# Patient Record
Sex: Female | Born: 1970 | Race: White | Hispanic: No | Marital: Single | Smoking: Never smoker
Health system: Southern US, Community
[De-identification: ages and names within clinical notes are randomized; demographics above are authoritative.]

## PROBLEM LIST (undated history)

## (undated) DIAGNOSIS — K829 Disease of gallbladder, unspecified: Secondary | ICD-10-CM

## (undated) DIAGNOSIS — K589 Irritable bowel syndrome without diarrhea: Secondary | ICD-10-CM

## (undated) DIAGNOSIS — I10 Essential (primary) hypertension: Secondary | ICD-10-CM

## (undated) DIAGNOSIS — R12 Heartburn: Secondary | ICD-10-CM

## (undated) DIAGNOSIS — M549 Dorsalgia, unspecified: Secondary | ICD-10-CM

## (undated) DIAGNOSIS — F329 Major depressive disorder, single episode, unspecified: Secondary | ICD-10-CM

## (undated) DIAGNOSIS — M2550 Pain in unspecified joint: Secondary | ICD-10-CM

## (undated) DIAGNOSIS — T7840XA Allergy, unspecified, initial encounter: Secondary | ICD-10-CM

## (undated) DIAGNOSIS — F419 Anxiety disorder, unspecified: Secondary | ICD-10-CM

## (undated) DIAGNOSIS — F32A Depression, unspecified: Secondary | ICD-10-CM

## (undated) DIAGNOSIS — J329 Chronic sinusitis, unspecified: Secondary | ICD-10-CM

## (undated) HISTORY — DX: Essential (primary) hypertension: I10

## (undated) HISTORY — DX: Depression, unspecified: F32.A

## (undated) HISTORY — PX: NASAL SINUS SURGERY: SHX719

## (undated) HISTORY — DX: Allergy, unspecified, initial encounter: T78.40XA

## (undated) HISTORY — DX: Anxiety disorder, unspecified: F41.9

## (undated) HISTORY — DX: Heartburn: R12

## (undated) HISTORY — DX: Irritable bowel syndrome, unspecified: K58.9

## (undated) HISTORY — DX: Chronic sinusitis, unspecified: J32.9

## (undated) HISTORY — DX: Pain in unspecified joint: M25.50

## (undated) HISTORY — DX: Major depressive disorder, single episode, unspecified: F32.9

## (undated) HISTORY — DX: Dorsalgia, unspecified: M54.9

## (undated) HISTORY — DX: Disease of gallbladder, unspecified: K82.9

---

## 2009-01-05 LAB — CONVERTED CEMR LAB: Pap Smear: NORMAL

## 2009-08-06 ENCOUNTER — Ambulatory Visit: Payer: Self-pay | Admitting: Internal Medicine

## 2009-08-06 DIAGNOSIS — I10 Essential (primary) hypertension: Secondary | ICD-10-CM | POA: Insufficient documentation

## 2009-08-06 DIAGNOSIS — F411 Generalized anxiety disorder: Secondary | ICD-10-CM | POA: Insufficient documentation

## 2009-08-06 DIAGNOSIS — F418 Other specified anxiety disorders: Secondary | ICD-10-CM

## 2010-02-03 ENCOUNTER — Ambulatory Visit: Payer: Self-pay | Admitting: Family Medicine

## 2010-02-06 LAB — CONVERTED CEMR LAB
ALT: 34 units/L (ref 0–35)
AST: 36 units/L (ref 0–37)
Albumin: 4.3 g/dL (ref 3.5–5.2)
Alkaline Phosphatase: 56 units/L (ref 39–117)
BUN: 11 mg/dL (ref 6–23)
Bilirubin, Direct: 0.1 mg/dL (ref 0.0–0.3)
Calcium: 9.2 mg/dL (ref 8.4–10.5)
Cholesterol: 187 mg/dL (ref 0–200)
Creatinine, Ser: 0.6 mg/dL (ref 0.4–1.2)
Direct LDL: 109.9 mg/dL
GFR calc non Af Amer: 114.08 mL/min (ref >60)
Total Protein: 7.1 g/dL (ref 6.0–8.3)
VLDL: 44.2 mg/dL — ABNORMAL HIGH (ref 0.0–40.0)

## 2010-02-10 ENCOUNTER — Encounter (INDEPENDENT_AMBULATORY_CARE_PROVIDER_SITE_OTHER): Payer: Self-pay | Admitting: *Deleted

## 2010-02-17 ENCOUNTER — Other Ambulatory Visit: Admission: RE | Admit: 2010-02-17 | Discharge: 2010-02-17 | Payer: Self-pay | Admitting: Family Medicine

## 2010-02-17 ENCOUNTER — Ambulatory Visit: Payer: Self-pay | Admitting: Family Medicine

## 2010-02-17 DIAGNOSIS — L439 Lichen planus, unspecified: Secondary | ICD-10-CM

## 2010-02-17 LAB — HM PAP SMEAR

## 2010-02-24 ENCOUNTER — Encounter: Payer: Self-pay | Admitting: Family Medicine

## 2010-02-24 LAB — CONVERTED CEMR LAB: Pap Smear: NEGATIVE

## 2010-03-31 ENCOUNTER — Ambulatory Visit: Payer: Self-pay | Admitting: Family Medicine

## 2010-06-07 HISTORY — PX: GALLBLADDER SURGERY: SHX652

## 2010-07-07 NOTE — Assessment & Plan Note (Signed)
Summary: FILL OUT FORM FOR WORK/CLE   Vital Signs:  Patient profile:   40 year old female Menstrual status:  regular Height:      65 inches Weight:      240 pounds BMI:     40.08 Temp:     98.8 degrees F oral Pulse rate:   84 / minute Pulse rhythm:   regular BP sitting:   130 / 90  (right arm) Cuff size:   large  Vitals Entered By: Linde Gillis CMA Duncan Dull) (March 31, 2010 8:07 AM) CC: fill out form for work   History of Present Illness: 40 yo here to fill out form for work. Needs a physical form, brings it in with her today.  Based on form, only immunization required is Tetanus.  She received Tdap on 06/07/2008.  Overall, doing well. 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  Current Medications (verified): 1)  Pristiq 100 Mg Xr24h-Tab (Desvenlafaxine Succinate) .... Take One Tablet By Mouth Daily 2)  Bentyl 20 Mg Tabs (Dicyclomine Hcl) .Marland Kitchen.. 1 Tab By Mouth Qid As Needed 3)  Lisinopril 20 Mg Tabs (Lisinopril) .Marland Kitchen.. 1 Tablet By Mouth Daily 4)  Alprazolam 1 Mg  Tabs (Alprazolam) .Marland Kitchen.. 1 Tab By Mouth Two Times A Day As Needed Anxiety 5)  Clobetasol Propionate 0.05 % Crea (Clobetasol Propionate) .... Apply To Area Nightly X 1 Month  Allergies (verified): No Known Drug Allergies  Past History:  Past Medical History: Last updated: 08/06/2009 Anxiety Depression Hypertension  Past Surgical History: Last updated: 08/06/2009 Sinus surgery  Family History: Last updated: 08/06/2009 Family History Diabetes 1st degree relative Family History Hypertension  Social History: Last updated: 02/03/2010 Occupation: Interior and spatial designer of Education Divorced Never Smoked Alcohol use-yes Drug use-no Regular exercise-yes One daughter, age 66, [place]  Risk Factors: Alcohol Use: 2 (08/06/2009) >5 drinks/d w/in last 3 months: no (08/06/2009) Exercise: yes (08/06/2009)  Risk Factors: Smoking Status: never (08/06/2009)  Review of Systems      See HPI General:  Denies  malaise. Eyes:  Denies blurring. ENT:  Denies difficulty swallowing. CV:  Denies chest pain or discomfort. Resp:  Denies shortness of breath. GI:  Denies abdominal pain and change in bowel habits. GU:  Denies discharge. MS:  Denies joint pain, joint redness, and joint swelling. Derm:  Denies rash. Neuro:  Denies headaches. Psych:  Denies anxiety and depression. Endo:  Denies cold intolerance and heat intolerance. Heme:  Denies abnormal bruising and bleeding.  Physical Exam  General:  alert, well-developed, well-nourished, well-hydrated, appropriate dress, normal appearance, healthy-appearing, cooperative to examination, and overweight-appearing.   Head:  normocephalic, atraumatic, no abnormalities observed, and no abnormalities palpated.   Eyes:  vision grossly intact, pupils equal, pupils round, and pupils reactive to light.   Ears:  R ear normal and L ear normal.   Nose:  no external deformity.   Mouth:  Oral mucosa and oropharynx without lesions or exudates.  Teeth in good repair. Neck:  supple, full ROM, no masses, no thyromegaly, no thyroid nodules or tenderness, no JVD, normal carotid upstroke, no cervical lymphadenopathy, and no neck tenderness.   Lungs:  normal respiratory effort, no intercostal retractions, no accessory muscle use, normal breath sounds, no dullness, and no fremitus.   Heart:  normal rate, regular rhythm, no murmur, no gallop, no rub, and no JVD.   Abdomen:  soft, non-tender, normal bowel sounds, no distention, no masses, no guarding, no rigidity, no hepatomegaly, and no splenomegaly.   Msk:  normal ROM, no  joint tenderness, no joint swelling, no joint warmth, no redness over joints, no joint deformities, no joint instability, and no crepitation.   Extremities:  No clubbing, cyanosis, edema, or deformity noted with normal full range of motion of all joints.   Neurologic:  No cranial nerve deficits noted. Station and gait are normal. Plantar reflexes are down-going  bilaterally. DTRs are symmetrical throughout. Sensory, motor and coordinative functions appear intact. Skin:  Intact without suspicious lesions or rashes Psych:  Cognition and judgment appear intact. Alert and cooperative with normal attention span and concentration. No apparent delusions, illusions, hallucinations   Impression & Recommendations:  Problem # 1:  UNSPECIFIED GENERAL MEDICAL EXAMINATION (ICD-V70.9) Assessment New Formed filled out and returned to patient stating that she has no physical limitations.  Complete Medication List: 1)  Pristiq 100 Mg Xr24h-tab (Desvenlafaxine succinate) .... Take one tablet by mouth daily 2)  Bentyl 20 Mg Tabs (Dicyclomine hcl) .Marland Kitchen.. 1 tab by mouth qid as needed 3)  Lisinopril 20 Mg Tabs (Lisinopril) .Marland Kitchen.. 1 tablet by mouth daily 4)  Alprazolam 1 Mg Tabs (Alprazolam) .Marland Kitchen.. 1 tab by mouth two times a day as needed anxiety 5)  Clobetasol Propionate 0.05 % Crea (Clobetasol propionate) .... Apply to area nightly x 1 month Prescriptions: ALPRAZOLAM 1 MG  TABS (ALPRAZOLAM) 1 tab by mouth two times a day as needed anxiety  #60 x 0   Entered and Authorized by:   Ruthe Mannan MD   Signed by:   Ruthe Mannan MD on 03/31/2010   Method used:   Print then Give to Patient   RxID:   [id number] CLOBETASOL PROPIONATE 0.05 % CREA (CLOBETASOL PROPIONATE) Apply to area nightly x 1 month  #30 g x 1   Entered and Authorized by:   Ruthe Mannan MD   Signed by:   Ruthe Mannan MD on 03/31/2010   Method used:   Electron[facility] to        [id number]       [address]       [place], [place]  [address]       Ph: [phone]       Fax: [phone]   RxID:   [id number] LISINOPRIL 20 MG TABS (LISINOPRIL) 1 tablet by mouth daily  #90 x 3   Entered and Authorized by:   Ruthe Mannan MD   Signed by:   Ruthe Mannan MD on 03/31/2010   Method used:   Electronically to        [id number] ([facility]       [address]        [place], [place]  [address]       Ph: [phone]       Fax: [phone]   RxID:   [id number] PRISTIQ 100 MG XR24H-TAB (DESVENLAFAXINE SUCCINATE) take one tablet by mouth daily  #30 x 6   Entered and Authorized by:   Ruthe Mannan MD   Signed by:   Ruthe Mannan MD on 03/31/2010   Method used:   Electronically to        [id number]       [address]       [place], [place]  [address]       Ph: [phone]       Fax: [phone]   RxID:   [id number]    Orders Added: 1)  Est. Patient 18-39 years [[address]]    Current Allergies (reviewed today): No known  allergies

## 2010-07-07 NOTE — Assessment & Plan Note (Signed)
Summary: PAP AND F/U HTN 30 MIN PER MD/DLO   Vital Signs:  Patient profile:   41 year old female Menstrual status:  regular Height:      65 inches Weight:      246 pounds BMI:     41.08 Temp:     98.6 degrees F oral Pulse rate:   80 / minute Pulse rhythm:   regular BP sitting:   130 / 90  (left arm) Cuff size:   large  Vitals Entered By: Linde Gillis CMA Duncan Dull) (February 17, 2010 12:07 PM) CC: pap and blood pressure concerns   History of Present Illness: 40 yo here for pap smear and to follow up blood pressure.   HTN- stopped the Atenolol 10 mg daily last month, feels much better.  Started Lisinopril 20 mg daily.  No known side effects, no dry cough, headaches, blurred vision or LE edema.   Well woman- no h/o abnormal pap smears.  Has noticed a redish, irritated area on her right labia.  Has told her OBGYN about in past, never been biopsied.  Current Medications (verified): 1)  Pristiq 100 Mg Xr24h-Tab (Desvenlafaxine Succinate) .... Take One Tablet By Mouth Daily 2)  Bentyl 20 Mg Tabs (Dicyclomine Hcl) .Marland Kitchen.. 1 Tab By Mouth Qid As Needed 3)  Lisinopril 20 Mg Tabs (Lisinopril) .Marland Kitchen.. 1 Tablet By Mouth Daily 4)  Alprazolam 1 Mg  Tabs (Alprazolam) .Marland Kitchen.. 1 Tab By Mouth Two Times A Day As Needed Anxiety 5)  Clobetasol Propionate 0.05 % Crea (Clobetasol Propionate) .... Apply To Area Nightly X 1 Month 6)  Azithromycin 250 Mg  Tabs (Azithromycin) .... 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 Eyes:  Denies blurring. CV:  Denies chest pain or discomfort. Resp:  Denies shortness of breath. GU:  Denies abnormal vaginal bleeding, discharge, dysuria, genital sores, urinary frequency, and urinary hesitancy.  Physical Exam  General:  alert, well-developed, well-nourished, well-hydrated, appropriate dress, normal appearance, healthy-appearing, cooperative to examination, and overweight-appearing.   Breasts:  No mass, nodules, thickening, tenderness, bulging, retraction, inflamation, nipple discharge or skin changes noted.   Lungs:  normal respiratory effort, no intercostal retractions, no accessory muscle use, normal breath sounds, no dullness, and no fremitus.   Heart:  normal rate, regular rhythm, no murmur, no gallop, no rub, and no JVD.   Abdomen:  soft, non-tender, normal bowel sounds, no distention, no masses, no guarding, no rigidity, no hepatomegaly, and no splenomegaly.   Genitalia:  Pelvic Exam:        External: right labia- 2 cm scaly erythematous area        Vagina: normal without lesions or masses        Cervix: normal without lesions or masses        Adnexa: normal bimanual exam without masses or fullness        Uterus: normal by palpation  Pap smear: performed Extremities:  No clubbing, cyanosis, edema, or deformity noted with normal full range of motion of all joints.   Neurologic:  No cranial nerve deficits noted. Station and gait are normal. Plantar reflexes are down-going bilaterally. DTRs are symmetrical throughout. Sensory, motor and coordinative functions appear intact. Skin:  Intact without suspicious lesions or rashes Psych:  Cognition and judgment appear intact. Alert and cooperative with normal attention span and  concentration. No apparent delusions, illusions, hallucinations   Impression & Recommendations:  Problem # 1:  Preventive Health Care (ICD-V70.0) Reviewed preventive care protocols, scheduled due services, and updated immunizations Discussed nutrition, exercise, diet, and healthy lifestyle.  Pap smear today.  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  Problem # 3:  HYPERTENSION (ICD-401.9) Assessment: Improved Continue Lisinopril 20 mg daily. Her updated medication list for this problem includes:    Lisinopril 20 Mg Tabs (Lisinopril) .Marland Kitchen... 1 tablet by mouth daily  Complete Medication List: 1)  Pristiq 100 Mg Xr24h-tab (Desvenlafaxine succinate) .... Take one tablet by mouth daily 2)  Bentyl 20 Mg Tabs (Dicyclomine hcl) .Marland Kitchen.. 1 tab by mouth qid as needed 3)  Lisinopril 20 Mg Tabs (Lisinopril) .Marland Kitchen.. 1 tablet by mouth daily 4)  Alprazolam 1 Mg Tabs (Alprazolam) .Marland Kitchen.. 1 tab by mouth two times a day as needed anxiety 5)  Clobetasol Propionate 0.05 % Crea (Clobetasol propionate) .... Apply to area nightly x 1 month 6)  Azithromycin 250 Mg Tabs (Azithromycin) .... 2 by  mouth today and then 1 daily for 4 days Prescriptions: AZITHROMYCIN 250 MG  TABS (AZITHROMYCIN) 2 by  mouth today and then 1 daily for 4 days  #6 x 0   Entered and Authorized by:   Ruthe Mannan MD   Signed by:   Ruthe Mannan MD on 02/17/2010   Method used:   Electronically to        [id number]       [address]       [place], [place]  [address]       Ph: [phone]       Fax: [phone]   RxID:   [id number] CLOBETASOL PROPIONATE 0.05 % CREA (CLOBETASOL PROPIONATE) Apply to area nightly x 1 month  #30 g x 0   Entered and Authorized by:   Ruthe Mannan MD   Signed by:   Ruthe Mannan MD on 02/17/2010   Method used:   Electronically to        [id number]        [address]       [place], [place]  [address]       Ph: [phone]       Fax: [phone]   RxID:   [id number] AZITHROMYCIN 250 MG  TABS (AZITHROMYCIN) 2 by  mouth today and then 1 daily for 4 days  #6 x 0   Entered and Authorized by:   Ruthe Mannan MD   Signed by:   Ruthe Mannan MD on 02/17/2010   Method used:   Electronically to        [facility] [phone][facility] (retail)       [address]       [place], [place]  [address]       Ph: [phone]  Fax: [phone]   RxID:   [id number] CLOBETASOL PROPIONATE 0.05 % CREA (CLOBETASOL PROPIONATE) Apply to area nightly x 1 month  #30 g x 0   Entered and Authorized by:   Ruthe Mannan MD   Signed by:   Ruthe Mannan MD on 02/17/2010   Method used:   Electronically to        [facility] [phone]* (retail)       [address]       [place], [place]  [address]       Ph: [phone]       Fax: [phone]   RxID:   [id number]   Current Allergies (reviewed today): No known allergies

## 2010-07-07 NOTE — Letter (Signed)
Summary: New Patient letter  [facility]  [address] [place], [place] [address]   Phone: [phone]  Fax: [phone]       02/10/2010 MRN: [id number]  Amber Frost [address] [place], [place]  [address]  Dear Ms. Para March,  Welcome to the Gastroenterology Division at [facility].    You are scheduled to see Dr.  Juanda Chance on 04-07-10 at 1:30P.M.  on the 3rd floor at [facility], [address]  We ask that you try to arrive at our office 15 minutes prior to your appointment time to allow for check-in.  We would like you to complete the enclosed self-administered evaluation form prior to your visit and bring it with you on the day of your appointment.  We will review it with you.  Also, please bring a complete list of all your medications or, if you prefer, bring the medication bottles and we will list them.  Please bring your insurance card so that we may make a copy of it.  If your insurance requires a referral to see a specialist, please bring your referral form from your primary care physician.  Co-payments are due at the time of your visit and may be paid by cash, check or credit card.     Your office visit will consist of a consult with your physician (includes a physical exam), any laboratory testing he/she may order, scheduling of any necessary diagnostic testing (e.g. x-ray, ultrasound, CT-scan), and scheduling of a procedure (e.g. Endoscopy, Colonoscopy) if required.  Please allow enough time on your schedule to allow for any/all of these possibilities.    If you cannot keep your appointment, please call [phone] to cancel or reschedule prior to your appointment date.  This allows [place] the opportunity to schedule an appointment for another patient in need of care.  If you do not cancel or reschedule by 5 p.m. the business day prior to your appointment date, you will be charged a $50.00 late cancellation/no-show fee.    Thank you for choosing  [facility] for your medical needs.  We appreciate the opportunity to care for you.  Please visit [place] at our website  to learn more about our practice.                     Sincerely,                                                             The Gastroenterology Division

## 2010-07-07 NOTE — Assessment & Plan Note (Signed)
Summary: NEW / Amber Frost  #   Vital Signs:  Patient profile:   40 year old female Menstrual status:  regular LMP:     07/12/2009 Height:      65 inches Weight:      242 pounds BMI:     40.42 O2 Sat:      97 % on Room air Temp:     97.2 degrees F oral Pulse rate:   86 / minute Pulse rhythm:   regular BP sitting:   126 / 88  (left arm) Cuff size:   large  Vitals Entered By: Rock Nephew CMA (August 06, 2009 4:13 PM)  O2 Flow:  Room air  Primary Care Provider:  Yetta Barre   History of Present Illness: New to me she complains of anxiety, panic, heart racing, and feeling jittery with no response to Pristiq after 5-6 months of  being on it. She tried to stop Pristiq "cold [place]" but she had severe dizziness (withdrawl). She has taken Xanax previously, last dose was several months ago.  Hypertension History:      She denies headache, chest pain, palpitations, dyspnea with exertion, orthopnea, PND, peripheral edema, visual symptoms, neurologic problems, syncope, and side effects from treatment.  She notes no problems with any antihypertensive medication side effects.        Positive major cardiovascular risk factors include hypertension.  Negative major cardiovascular risk factors include female age less than 27 years old, no history of diabetes or hyperlipidemia, negative family history for ischemic heart disease, and non-tobacco-user status.        Further assessment for target organ damage reveals no history of ASHD, cardiac end-organ damage (CHF/LVH), stroke/TIA, peripheral vascular disease, renal insufficiency, or hypertensive retinopathy.     Preventive Screening-Counseling & Management  Alcohol-Tobacco     Alcohol drinks/day: 2     Alcohol type: beer     >5/day in last 3 mos: no     Alcohol Counseling: not indicated; use of alcohol is not excessive or problematic     Feels need to cut down: no     Feels annoyed by complaints: no     Feels guilty re: drinking: no     Needs 'eye  opener' in am: no     Smoking Status: never  Caffeine-Diet-Exercise     Does Patient Exercise: yes  Hep-HIV-STD-Contraception     Hepatitis Risk: no risk noted     HIV Risk: no risk noted     STD Risk: no risk noted      Sexual History:  not sexually active.        Drug Use:  no.        Blood Transfusions:  no.    Medications Prior to Update: 1)  None  Current Medications (verified): 1)  Atenolol 100 Mg Tabs (Atenolol) 2)  Pristiq 50 Mg Xr24h-Tab (Desvenlafaxine Succinate) 3)  Klonopin 1 Mg Tabs (Clonazepam) .Marland Kitchen.. 1-2 By Mouth Two Times A Day As Needed For Anxiety/painc  Allergies (verified): No Known Drug Allergies  Past History:  Past Medical History: Anxiety Depression Hypertension  Past Surgical History: Sinus surgery  Family History: Family History Diabetes 1st degree relative Family History Hypertension  Social History: Occupation: Press photographer Divorced Never Smoked Alcohol use-yes Drug use-no Regular exercise-yes Smoking Status:  never Hepatitis Risk:  no risk noted HIV Risk:  no risk noted STD Risk:  no risk noted Sexual History:  not sexually active Blood Transfusions:  no Drug Use:  no Does Patient Exercise:  yes  Review of Systems  The patient denies chest pain, syncope, dyspnea on exertion, peripheral edema, prolonged cough, headaches, hemoptysis, abdominal pain, and hematuria.   Psych:  Complains of anxiety and panic attacks; denies alternate hallucination ( auditory/visual), depression, easily angered, easily tearful, irritability, mental problems, sense of great danger, suicidal thoughts/plans, thoughts of violence, unusual visions or sounds, and thoughts /plans of harming others.  Physical Exam  General:  alert, well-developed, well-nourished, well-hydrated, appropriate dress, normal appearance, healthy-appearing, cooperative to examination, and overweight-appearing.   Head:  normocephalic, atraumatic, no abnormalities  observed, and no abnormalities palpated.   Eyes:  vision grossly intact, pupils equal, pupils round, and pupils reactive to light.   Mouth:  Oral mucosa and oropharynx without lesions or exudates.  Teeth in good repair. Neck:  supple, full ROM, no masses, no thyromegaly, no thyroid nodules or tenderness, no JVD, normal carotid upstroke, no cervical lymphadenopathy, and no neck tenderness.   Lungs:  normal respiratory effort, no intercostal retractions, no accessory muscle use, normal breath sounds, no dullness, and no fremitus.   Heart:  normal rate, regular rhythm, no murmur, no gallop, no rub, and no JVD.   Abdomen:  soft, non-tender, normal bowel sounds, no distention, no masses, no guarding, no rigidity, no hepatomegaly, and no splenomegaly.   Msk:  normal ROM, no joint tenderness, no joint swelling, no joint warmth, no redness over joints, no joint deformities, no joint instability, and no crepitation.   Pulses:  R and L carotid,radial,femoral,dorsalis pedis and posterior tibial pulses are full and equal bilaterally Extremities:  No clubbing, cyanosis, edema, or deformity noted with normal full range of motion of all joints.   Neurologic:  No cranial nerve deficits noted. Station and gait are normal. Plantar reflexes are down-going bilaterally. DTRs are symmetrical throughout. Sensory, motor and coordinative functions appear intact. Skin:  Intact without suspicious lesions or rashes Cervical Nodes:  no anterior cervical adenopathy and no posterior cervical adenopathy.   Axillary Nodes:  no R axillary adenopathy and no L axillary adenopathy.   Psych:  Cognition and judgment appear intact. Alert and cooperative with normal attention span and concentration. No apparent delusions, illusions, hallucinations   Impression & Recommendations:  Problem # 1:  ANXIETY (ICD-300.00) Assessment Deteriorated  she has not responded well to pristiq so willi advise her on how to taper it. she may benefit  from an SSRI in the future. I think she will need a benzo. intermittenly and have recommended that she start psychotherapy.  Her updated medication list for this problem includes:    Pristiq 50 Mg Xr24h-tab (Desvenlafaxine succinate) ..... One by mouth once daily    Klonopin 1 Mg Tabs (Clonazepam) .Marland Kitchen... 1-2 by mouth two times a day as needed for anxiety/painc  Discussed medication use and relaxation techniques.   Complete Medication List: 1)  Atenolol 100 Mg Tabs (Atenolol) 2)  Pristiq 50 Mg Xr24h-tab (Desvenlafaxine succinate) .... One by mouth once daily 3)  Klonopin 1 Mg Tabs (Clonazepam) .Marland Kitchen.. 1-2 by mouth two times a day as needed for anxiety/painc  Hypertension Assessment/Plan:      The patient's hypertensive risk group is category A: No risk factors and no target organ damage.  Today's blood pressure is 126/88.  Her blood pressure goal is < 140/90.  Patient Instructions: 1)  Taper the Pristiq as we discussed today. Consider starting psychotherapy. ( see business cards ). 2)  Please schedule a follow-up appointment in 2 months. 3)  If  you could be exposed to sexually transmitted diseases, you should use a condom. 4)  If you are having sex and you or your partner don't want a child, use contraception. Prescriptions: PRISTIQ 50 MG XR24H-TAB (DESVENLAFAXINE SUCCINATE) one by mouth once daily  #30 x 3   Entered and Authorized by:   Etta Grandchild MD   Signed by:   Etta Grandchild MD on 08/06/2009   Method used:   [facility]  [address] [phone][facility]       [address]       [place], [place]  [address]       Ph: [phone]       Fax: [phone]   RxID:   [id number] KLONOPIN 1 MG TABS (CLONAZEPAM) 1-2 by mouth two times a day as needed for anxiety/painc  #50 x 2   Entered and Authorized by:   Etta Grandchild MD   Signed by:   Etta Grandchild MD on 08/06/2009   Method used:   Print then Give to Patient   RxID:    [id number]   Preventive Care Screening  Pap Smear:    Date:  01/05/2009    Results:  normal   Last Tetanus Booster:    Date:  06/07/2008    Results:  Historical

## 2010-07-07 NOTE — Letter (Signed)
Summary: Results Follow up Letter  [name]  [address] [place], [place] [address]   Phone: [phone]  Fax: [phone]    02/24/2010 MRN: [id number]  Amber Frost [address] [place], [place]  [address]  Dear [name],  The following are the results of your recent test(s):  Test         Result    Pap Smear:        Normal __X___  Not Normal _____ Comments: ______________________________________________________ Cholesterol: LDL(Bad cholesterol):         Your goal is less than:         HDL (Good cholesterol):       Your goal is more than: Comments:  ______________________________________________________ Mammogram:        Normal _____  Not Normal _____ Comments:  ___________________________________________________________________ Hemoccult:        Normal _____  Not normal _______ Comments:    _____________________________________________________________________ Other Tests:    We routinely do not discuss normal results over the telephone.  If you desire a copy of the results, or you have any questions about this information we can discuss them at your next office visit.   Sincerely,       Linde Gillis, CMA (AAMA)for Dr. Ruthe Mannan

## 2010-07-07 NOTE — Assessment & Plan Note (Signed)
Summary: NEW PT TO ESTABH/DLO   Vital Signs:  Patient profile:   40 year old female Menstrual status:  regular Height:      65 inches Weight:      240.38 pounds BMI:     40.15 Temp:     97.9 degrees F oral Pulse rate:   68 / minute Pulse rhythm:   regular BP sitting:   120 / 82  (left arm) Cuff size:   large  Vitals Entered By: Linde Gillis CMA Duncan Dull) (February 03, 2010 10:58 AM) CC: new patient, establish care   CC:  new patient and establish care.  History of Present Illness: 40 yo here to establish care.  1.  Abdominal cramping, loose stools- ongoing for years but seems to be getting worse.  Under more stress at work and at home, going through a separation with her husband.  No blood or mucous in her stool.  No nausea vomitng or fevers.  Worsened by foods but has not noticed if some foods are worse than others.  No abdominal pain, just cramping that is relieved by defecation.  2.  Anxiety/depression- on Pristiq 100 mg daily and Klonopin 1 mg two times a day panic attacks.  Does not take her Klonopin because it makes her too sleepy.  Pristiq was working ok until recently stressors.  Now more tearful, more anhedonia.  No SI or HI.  Tried to come off it last year but felt horrible so got back on it.  3.  HTN- on Atenolol 10 mg daily prescribed by her OBGYN.  Not for migraine prophylaxis.  No SOB, CP or blurred vision.  Current Medications (verified): 1)  Pristiq 100 Mg Xr24h-Tab (Desvenlafaxine Succinate) .... Take One Tablet By Mouth Daily 2)  Bentyl 20 Mg Tabs (Dicyclomine Hcl) .Marland Kitchen.. 1 Tab By Mouth Qid As Needed 3)  Lisinopril 20 Mg Tabs (Lisinopril) .Marland Kitchen.. 1 Tablet By Mouth Daily 4)  Alprazolam 1 Mg  Tabs (Alprazolam) .Marland Kitchen.. 1 Tab By Mouth Two Times A Day As Needed Anxiety  Allergies (verified): No Known Drug Allergies  Past History:  Past Medical History: Last updated: 08/06/2009 Anxiety Depression Hypertension  Past Surgical History: Last updated: 08/06/2009 Sinus  surgery  Family History: Last updated: 08/06/2009 Family History Diabetes 1st degree relative Family History Hypertension  Social History: Last updated: 02/03/2010 Occupation: Interior and spatial designer of Education Divorced Never Smoked Alcohol use-yes Drug use-no Regular exercise-yes One daughter, age 29, [place]  Risk Factors: Alcohol Use: 2 (08/06/2009) >5 drinks/d w/in last 3 months: no (08/06/2009) Exercise: yes (08/06/2009)  Risk Factors: Smoking Status: never (08/06/2009)  Social History: 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 Eyes:  Denies blurring. ENT:  Denies difficulty swallowing. CV:  Denies chest pain or discomfort. Resp:  Denies shortness of breath. GI:  Complains of abdominal pain, diarrhea, and gas; denies bloody stools, constipation, dark tarry stools, indigestion, loss of appetite, nausea, vomiting, vomiting blood, and yellowish skin color. GU:  Denies abnormal vaginal bleeding and discharge. MS:  Denies joint pain, joint redness, and joint swelling. Derm:  Denies rash. Neuro:  Denies headaches. Psych:  Complains of anxiety and depression; denies sense of great danger, suicidal thoughts/plans, thoughts of violence, unusual visions or sounds, and thoughts /plans of harming others. Endo:  Denies cold intolerance and heat intolerance. Heme:  Denies abnormal bruising and  bleeding.  Physical Exam  General:  alert, well-developed, well-nourished, well-hydrated, appropriate dress, normal appearance, healthy-appearing, cooperative to examination, and overweight-appearing.   Head:  normocephalic, atraumatic, no abnormalities observed, and no abnormalities palpated.   Eyes:  vision grossly intact, pupils equal, pupils round, and pupils reactive to light.   Ears:  R ear normal and L ear normal.   Nose:   no external deformity.   Mouth:  Oral mucosa and oropharynx without lesions or exudates.  Teeth in good repair. Lungs:  normal respiratory effort, no intercostal retractions, no accessory muscle use, normal breath sounds, no dullness, and no fremitus.   Heart:  normal rate, regular rhythm, no murmur, no gallop, no rub, and no JVD.   Abdomen:  soft, non-tender, normal bowel sounds, no distention, no masses, no guarding, no rigidity, no hepatomegaly, and no splenomegaly.   Msk:  normal ROM, no joint tenderness, no joint swelling, no joint warmth, no redness over joints, no joint deformities, no joint instability, and no crepitation.   Extremities:  No clubbing, cyanosis, edema, or deformity noted with normal full range of motion of all joints.   Neurologic:  No cranial nerve deficits noted. Station and gait are normal. Plantar reflexes are down-going bilaterally. DTRs are symmetrical throughout. Sensory, motor and coordinative functions appear intact. Skin:  Intact without suspicious lesions or rashes Psych:  Cognition and judgment appear intact. Alert and cooperative with normal attention span and concentration. No apparent delusions, illusions, hallucinations   Impression & Recommendations:  Problem # 1:  ABDOMINAL PAIN, UNSPECIFIED SITE (ICD-789.00) Assessment New Symptoms appear most consistent with IBS. Will start Bentyl 20 mg four times daily as needed and refer to GI to rule out other concerns, such as IBD. Orders: Gastroenterology Referral (GI)  Problem # 2:  HYPERTENSION (ICD-401.9) Assessment: Unchanged Well controlled on Atenolol but this is not best medication for her considering her depressive symptoms and intermittent dizziness. Will d/c atenolol, check BMET and start Lisinopril.  Follow up in 2 weeks. The following medications were removed from the medication list:    Atenolol 100 Mg Tabs (Atenolol) Her updated medication list for this problem includes:    Lisinopril 20 Mg  Tabs (Lisinopril) .Marland Kitchen... 1 tablet by mouth daily  Orders: Venipuncture (21308) TLB-BMP (Basic Metabolic Panel-BMET) (80048-METABOL)  Problem # 3:  ANXIETY (ICD-300.00) Assessment: Deteriorated D/c klonipin- too long acting and start Alprazolam as needed panic attacks. Continue Pristiq at current dose but I feel she would benefit most from psychotherapy.  Referred pt to Dr. Lorenda Cahill. The following medications were removed from the medication list:    Klonopin 1 Mg Tabs (Clonazepam) .Marland Kitchen... 1-2 by mouth two times a day as needed for anxiety/painc Her updated medication list for this problem includes:    Pristiq 100 Mg Xr24h-tab (Desvenlafaxine succinate) .Marland Kitchen... Take one tablet by mouth daily    Alprazolam 1 Mg Tabs (Alprazolam) .Marland Kitchen... 1 tab by mouth two times a day as needed anxiety  Complete Medication List: 1)  Pristiq 100 Mg Xr24h-tab (Desvenlafaxine succinate) .... Take one tablet by mouth daily 2)  Bentyl 20 Mg Tabs (Dicyclomine hcl) .Marland Kitchen.. 1 tab by mouth qid as needed 3)  Lisinopril 20 Mg Tabs (Lisinopril) .Marland Kitchen.. 1 tablet by mouth daily 4)  Alprazolam 1 Mg Tabs (Alprazolam) .Marland Kitchen.. 1 tab by mouth two times a day as needed anxiety  Other Orders: TLB-Lipid Panel (80061-LIPID) TLB-Hepatic/Liver Function Pnl (80076-HEPATIC)  Patient Instructions: 1)  Dr. Lorenda Cahill 2)  [facility]  3)  [address]  4)  [address] 5)  [place],  [place]  [address]  6)  [phone] 7)  Please stop by to see Shirlee Limerick on your way out. 8)  Please make an appt to come see me in 2 weeks (Pap and follow up HTN, 30 min) Prescriptions: LISINOPRIL 20 MG TABS (LISINOPRIL) 1 tablet by mouth daily  #90 x 3   Entered and Authorized by:   Ruthe Mannan MD   Signed by:   Ruthe Mannan MD on 02/03/2010   Method used:   [facility] to        [id number]       [address]       [place], [place]  [address]       Ph: [phone]       Fax: [phone]    RxID:   [id number] BENTYL 20 MG TABS (DICYCLOMINE HCL) 1 tab by mouth qid as needed  #120 x 0   Entered and Authorized by:   Ruthe Mannan MD   Signed by:   Ruthe Mannan MD on 02/03/2010   Method used:   Electronically to        [id number] ([facility]       [address]       [place], [place]  [address]       Ph: [phone]       Fax: [phone]   RxID:   [id number] ALPRAZOLAM 1 MG  TABS (ALPRAZOLAM) 1 tab by mouth two times a day as needed anxiety  #60 x 0   Entered and Authorized by:   Ruthe Mannan MD   Signed by:   Ruthe Mannan MD on 02/03/2010   Method used:   Print then Give to Patient   RxID:   [id number] LISINOPRIL 20 MG TABS (LISINOPRIL) 1 tablet by mouth daily  #90 x 3   Entered and Authorized by:   Ruthe Mannan MD   Signed by:   Ruthe Mannan MD on 02/03/2010   Method used:   Electron[facility] to        [facility] [phone]* (retail)       [address]       [place], [place]  [address]       Ph: [phone]       Fax: [phone]   RxID:   [id number] BENTYL 20 MG TABS (DICYCLOMINE HCL) 1 tab by mouth qid as needed  #120 x 0   Entered and Authorized by:   Ruthe Mannan MD   Signed by:   Ruthe Mannan MD on 02/03/2010   Method used:   Electronically to        [facility] [phone]* (retail)       [address]       [place], [place]  [address]       Ph: [phone]       Fax: [phone]   RxID:   [id number]   Current Allergies (reviewed today): No known allergies

## 2010-07-22 ENCOUNTER — Telehealth: Payer: Self-pay | Admitting: Family Medicine

## 2010-07-29 NOTE — Progress Notes (Signed)
Summary: refill request for alprazolam  Phone Note Refill Request Call back at Home Phone [phone] Message from:  Patient  Refills Requested: Medication #1:  ALPRAZOLAM 1 MG  TABS 1 tab by mouth two times a day as needed anxiety [facility] pt[facility]  Initial call taken by: Lowella Petties CMA, AAMA,  July 22, 2010 11:49 AM  Follow-up for Phone Call        [facility] Follow-up by: Linde Gillis CMA Duncan Dull),  July 22, 2010 11:52 AM    Prescriptions: ALPRAZOLAM 1 MG  TABS (ALPRAZOLAM) 1 tab by mouth two times a day as needed anxiety  #60 x 0   Entered and Authorized by:   Ruthe Mannan MD   Signed by:   Ruthe Mannan MD on 07/22/2010   Method used:   Telephoned to ...       [facility] [phone][facility] (retail)       [address]       [place], [place]  [address]       Ph: [phone]       Fax: [phone]   RxID:   [id number]

## 2010-09-04 ENCOUNTER — Ambulatory Visit (INDEPENDENT_AMBULATORY_CARE_PROVIDER_SITE_OTHER): Payer: BC Managed Care – PPO | Admitting: Family Medicine

## 2010-09-04 ENCOUNTER — Encounter: Payer: Self-pay | Admitting: Family Medicine

## 2010-09-04 VITALS — BP 140/90 | HR 107 | Temp 98.5°F | Ht 65.0 in | Wt 246.1 lb

## 2010-09-04 DIAGNOSIS — J019 Acute sinusitis, unspecified: Secondary | ICD-10-CM | POA: Insufficient documentation

## 2010-09-04 MED ORDER — DESVENLAFAXINE SUCCINATE ER 100 MG PO TB24: 100.0000 mg | ORAL_TABLET | Freq: Every day | ORAL | Status: DC

## 2010-09-04 MED ORDER — AMOXICILLIN 500 MG PO CAPS: 500.0000 mg | ORAL_CAPSULE | Freq: Two times a day (BID) | ORAL | Status: AC

## 2010-09-04 MED ORDER — LISINOPRIL 20 MG PO TABS: 20.0000 mg | ORAL_TABLET | Freq: Every day | ORAL | Status: DC

## 2010-09-04 MED ORDER — IBUPROFEN 800 MG PO TABS: 800.0000 mg | ORAL_TABLET | Freq: Three times a day (TID) | ORAL | Status: AC | PRN

## 2010-09-04 NOTE — Patient Instructions (Signed)
Take antibiotic as directed.  Drink lots of fluids.  Treat sympotmatically with Mucinex, nasal saline irrigation, and Tylenol/Ibuprofen. Also try claritin D or Allegra D over the counter- two times a day as needed ( have to sign for them at pharmacy). You can use warm compresses.  Cough suppressant at night. Call if not improving as expected in 5-7 days.

## 2010-09-04 NOTE — Assessment & Plan Note (Signed)
New. Given duration and progression of symptoms, will treat for bacterial sinusitis. Amoxicillin 500 mg twice daily. Supportive care as per pt instructions.

## 2010-09-04 NOTE — Progress Notes (Signed)
duration of symptoms: 2 weeks Rhinorrhea: yes Congestion: yes ear pain: bilateral sore throat: yes cough:dry myalgias:no other concerns:none  ROS: See HPI.  Otherwise negative.    Meds, vitals, and allergies reviewed.   Physical exam: BP 140/90  Pulse 107  Temp(Src) 98.5 F (36.9 C) (Oral)  Ht 5\' 5"  (1.651 m)  Wt 246 lb 1.9 oz (111.639 kg)  BMI 40.96 kg/m2  LMP 08/30/2010 GEN: nad, alert and oriented HEENT: mucous membranes moist, TM w/o erythema, nasal epithelium injected, OP with cobblestoning NECK: supple w/o LA CV: rrr. PULM: ctab, no inc wob ABD: soft, +bs EXT: no edema

## 2010-10-28 ENCOUNTER — Other Ambulatory Visit: Payer: Self-pay | Admitting: *Deleted

## 2010-10-28 MED ORDER — ALPRAZOLAM 1 MG PO TABS: 1.0000 mg | ORAL_TABLET | Freq: Two times a day (BID) | ORAL | Status: DC | PRN

## 2010-10-28 NOTE — Telephone Encounter (Signed)
[facility] 

## 2010-12-08 ENCOUNTER — Other Ambulatory Visit: Payer: Self-pay | Admitting: *Deleted

## 2010-12-08 MED ORDER — ALPRAZOLAM 1 MG PO TABS: 1.0000 mg | ORAL_TABLET | Freq: Two times a day (BID) | ORAL | Status: DC | PRN

## 2010-12-08 NOTE — Telephone Encounter (Signed)
[facility] 

## 2011-01-13 ENCOUNTER — Encounter: Payer: Self-pay | Admitting: Family Medicine

## 2011-01-13 ENCOUNTER — Ambulatory Visit (INDEPENDENT_AMBULATORY_CARE_PROVIDER_SITE_OTHER): Payer: BC Managed Care – PPO | Admitting: Family Medicine

## 2011-01-13 VITALS — BP 140/88 | HR 99 | Temp 98.5°F | Ht 65.0 in | Wt 251.4 lb

## 2011-01-13 DIAGNOSIS — J020 Streptococcal pharyngitis: Secondary | ICD-10-CM

## 2011-01-13 DIAGNOSIS — J029 Acute pharyngitis, unspecified: Secondary | ICD-10-CM

## 2011-01-13 LAB — POCT RAPID STREP A (OFFICE): Rapid Strep A Screen: NEGATIVE

## 2011-01-13 NOTE — Progress Notes (Signed)
  Subjective:    Patient ID: Amber Frost, female    DOB: [birth year], 40 y.o.   MRN: [id number]  HPI  40 year old female:  Having a lot of pain with her throat and body aches.  Some cough and chest congestion. A month ago had some pneumonia. DId a round of levaquin.  Some nasal congestion and ear cong  A lot of body aches and pain.   Review of Systems ROS: GEN: Acute illness details above GI: Tolerating PO intake GU: maintaining adequate hydration and urination Pulm: No SOB Interactive and getting along well at home.  Otherwise, ROS is as per the HPI.     Objective:   Physical Exam   Physical Exam  Blood pressure 140/88, pulse 99, temperature 98.5 F (36.9 C), temperature source Oral, height 5\' 5"  (1.651 m), weight 251 lb 6.4 oz (114.034 kg), SpO2 98.00%.  Gen: WDWN, NAD; A & O x3, cooperative. Pleasant.Globally Non-toxic HEENT: Normocephalic and atraumatic. Throat clear, w/o exudate, R TM + fluid, L TM - good landmarks, + fluid present. no rhinnorhea.  MMM Frontal sinuses: NT Max sinuses: NT NECK: Anterior cervical  LAD is absent CV: RRR, No M/G/R, cap refill <2 sec PULM: Breathing comfortably in no respiratory distress. no wheezing, crackles, rhonchi EXT: No c/c/e PSYCH: Friendly, good eye contact MSK: Nml gait        Assessment & Plan:   1. Sore throat  POCT rapid strep A    Strep neg Viral infection with nasal cong and sore throat Rx for magic mouthwash handwritten

## 2011-01-13 NOTE — Patient Instructions (Signed)
SORE THROAT -Most caused by infections, 80-85% are viral injections.  -Strep throat is bacterial and requires antibiotics -Drainage and cough can irritate throat  TREATMENT 1. Warm liquids, salt water gargles to help with the sore throat. 2. Chloraseptic as needed can help a lot 3. Cough drops, popsicles, or hard candy 4. Liquids - drink plenty, without caffeine 5. Salt water gargle: 1/2 tsp salt in 1/2 glass warm water 6. Avoid spicy food 7. Get plenty of sleep 8. Ice chips for comfort  

## 2011-04-07 ENCOUNTER — Other Ambulatory Visit: Payer: Self-pay | Admitting: *Deleted

## 2011-04-07 NOTE — Telephone Encounter (Signed)
Last refill 12/30/2010.

## 2011-04-08 MED ORDER — ALPRAZOLAM 1 MG PO TABS: 1.0000 mg | ORAL_TABLET | Freq: Two times a day (BID) | ORAL | Status: DC | PRN

## 2011-04-08 NOTE — Telephone Encounter (Signed)
[facility] 

## 2011-06-02 ENCOUNTER — Other Ambulatory Visit: Payer: Self-pay | Admitting: Family Medicine

## 2011-06-03 ENCOUNTER — Encounter: Payer: Self-pay | Admitting: Family Medicine

## 2011-06-04 ENCOUNTER — Encounter: Payer: Self-pay | Admitting: Family Medicine

## 2011-06-04 ENCOUNTER — Ambulatory Visit (INDEPENDENT_AMBULATORY_CARE_PROVIDER_SITE_OTHER): Payer: BC Managed Care – PPO | Admitting: Family Medicine

## 2011-06-04 VITALS — BP 122/80 | HR 80 | Temp 97.8°F | Wt 246.0 lb

## 2011-06-04 DIAGNOSIS — J019 Acute sinusitis, unspecified: Secondary | ICD-10-CM

## 2011-06-04 MED ORDER — CLOBETASOL PROPIONATE 0.05 % EX CREA: 1.0000 "application " | TOPICAL_CREAM | Freq: Every day | CUTANEOUS | Status: DC

## 2011-06-04 MED ORDER — AZITHROMYCIN 250 MG PO TABS: ORAL_TABLET | ORAL | Status: AC

## 2011-06-04 MED ORDER — ALPRAZOLAM 1 MG PO TABS: 1.0000 mg | ORAL_TABLET | Freq: Three times a day (TID) | ORAL | Status: DC | PRN

## 2011-06-04 NOTE — Patient Instructions (Signed)
Take antibiotic as directed.  Drink lots of fluids.  Treat sympotmatically with Mucinex, nasal saline irrigation, and Tylenol/Ibuprofen. Also try claritin D or zyrtec D over the counter- two times a day as needed ( have to sign for them at pharmacy). You can use warm compresses.  Cough suppressant at night. Call if not improving as expected in 5-7 days.    

## 2011-06-04 NOTE — Progress Notes (Signed)
SUBJECTIVE:  Amber Frost is a 40 y.o. female who complains of coryza, congestion, sore throat and bilateral sinus pain for 8 days, getting progressively worse. She denies a history of anorexia, chest pain, dizziness, fevers, myalgias, nausea and shortness of breath and denies a history of asthma. Patient denies smoke cigarettes.   Patient Active Problem List  Diagnoses  . ANXIETY  . DEPRESSION  . HYPERTENSION  . LICHEN PLANUS  . Sinusitis acute   Past Medical History  Diagnosis Date  . Anxiety   . Depression   . Hypertension    Past Surgical History  Procedure Date  . Nasal sinus surgery   . Nasal sinus surgery    History  Substance Use Topics  . Smoking status: Never Smoker   . Smokeless tobacco: Not on file  . Alcohol Use: Yes   Family History  Problem Relation Age of Onset  . Diabetes Other   . Hypertension Other    No Known Allergies Current Outpatient Prescriptions on File Prior to Visit  Medication Sig Dispense Refill  . lisinopril (PRINIVIL,ZESTRIL) 20 MG tablet Take 1 tablet (20 mg total) by mouth daily.  30 tablet  6  . PRISTIQ 100 MG 24 hr tablet TAKE 1 TABLET BY MOUTH EVERY DAY  30 tablet  6   The PMH, PSH, Social History, Family History, Medications, and allergies have been reviewed in [facility], and have been updated if relevant.  OBJECTIVE: BP 122/80  Pulse 80  Temp(Src) 97.8 F (36.6 C) (Oral)  Wt 246 lb (111.585 kg)  LMP 05/31/2011  She appears well, vital signs are as noted. Ears normal.  Throat and pharynx normal.  Neck supple. No adenopathy in the neck. Nose is congested. Sinuses +/- TTP throughout. The chest is clear, without wheezes or rales.  ASSESSMENT:  sinusitis  PLAN: Given duration and progression of symptoms, will treat for bacterial sinusitis. Symptomatic therapy suggested: push fluids, rest and return office visit prn if symptoms persist or worsen.  Call or return to clinic prn if these symptoms worsen or fail to improve as  anticipated.

## 2011-06-09 ENCOUNTER — Telehealth: Payer: Self-pay | Admitting: *Deleted

## 2011-06-09 NOTE — Telephone Encounter (Signed)
Patient advised via message left on cell phone voicemail.   

## 2011-06-09 NOTE — Telephone Encounter (Signed)
She needs to give it a little more time.  Zpack is still in her system.  Try taking decongestants like sudafed or those with sudafed like claritin or zyrtec d to help with ear pressure.

## 2011-06-09 NOTE — Telephone Encounter (Signed)
Patient was seen on 06/04/2011, she stated that she had a sinus infection and an ear infection.  She stated that the sinus infection is gone but her ears are still hurting.  She wants to know if she can get a Rx for Augmentin or another antibiotic for her ears.  Please advise.  [facility]

## 2011-08-10 ENCOUNTER — Ambulatory Visit: Payer: BC Managed Care – PPO | Admitting: Internal Medicine

## 2011-08-10 ENCOUNTER — Telehealth: Payer: Self-pay | Admitting: Family Medicine

## 2011-08-10 ENCOUNTER — Telehealth: Payer: Self-pay | Admitting: *Deleted

## 2011-08-10 ENCOUNTER — Emergency Department: Payer: Self-pay | Admitting: Emergency Medicine

## 2011-08-10 LAB — URINALYSIS, COMPLETE
Bacteria: NONE SEEN
Blood: NEGATIVE
Leukocyte Esterase: NEGATIVE
Ph: 7 (ref 4.5–8.0)
RBC,UR: <1 /HPF (ref 0–5)
Squamous Epithelial: 8

## 2011-08-10 LAB — COMPREHENSIVE METABOLIC PANEL
Albumin: 3.9 g/dL (ref 3.4–5.0)
Alkaline Phosphatase: 51 U/L (ref 50–136)
Anion Gap: 8 (ref 7–16)
BUN: 11 mg/dL (ref 7–18)
Bilirubin,Total: 0.5 mg/dL (ref 0.2–1.0)
Creatinine: 0.72 mg/dL (ref 0.60–1.30)
Glucose: 90 mg/dL (ref 65–99)
Osmolality: 275 (ref 275–301)
Potassium: 4.2 mmol/L (ref 3.5–5.1)
SGOT(AST): 17 U/L (ref 15–37)
SGPT (ALT): 23 U/L
Total Protein: 7.6 g/dL (ref 6.4–8.2)

## 2011-08-10 LAB — CBC
HGB: 14 g/dL (ref 12.0–16.0)
MCH: 32.9 pg (ref 26.0–34.0)
MCHC: 34.5 g/dL (ref 32.0–36.0)
MCV: 95 fL (ref 80–100)
Platelet: 190 10*3/uL (ref 150–440)
RBC: 4.25 10*6/uL (ref 3.80–5.20)
RDW: 12.6 % (ref 11.5–14.5)
WBC: 4.8 10*3/uL (ref 3.6–11.0)

## 2011-08-10 LAB — CK TOTAL AND CKMB (NOT AT ARMC): CK-MB: 1 ng/mL (ref 0.5–3.6)

## 2011-08-10 LAB — TROPONIN I: Troponin-I: <0.02 ng/mL

## 2011-08-10 LAB — LIPASE, BLOOD: Lipase: 106 U/L (ref 73–393)

## 2011-08-10 NOTE — Telephone Encounter (Signed)
Spoke with patient, she says she did go to [facility] and was diagnosed with a large gallstone and referred to Dr. Excell Seltzer, surgeon in [place], for removal.  She was also referred to Dr. Lenard Galloway for a cardiac stress test, since the gall stone explains a lot of her symptoms but not the jaw and neck pain.

## 2011-08-10 NOTE — Telephone Encounter (Signed)
Patient advised to go to ER WITH CHEST PRESSURE AND ARM PAIN. Patient is agreeable and will go to er.

## 2011-08-10 NOTE — Telephone Encounter (Signed)
Triage Record Num: [id number] Operator: Baldomero Lamy Patient Name: Amber Frost Call Date & Time: 08/10/2011 8:36:21AM Patient Phone: [phone] PCP: Ruthe Mannan Patient Gender: Female PCP Fax : [phone] Patient DOB: 08/25/1970 Practice Name: [facility] Reason for Call: Caller: Cordia/Patient; PCP: Ruthe Mannan Nestor Ramp); CB#: [phone]; ; ; Call regarding Dizzyness, Nausea and Pain in Arms; Pt calling regarding chest pain, nausea, pain in arms and elevated BP on 08/09/11. Pt states that today she feels better but is still having weakness and pressure on rt side of chest. Emergent sxs of Chest Pain-Chest discomfort associated with SOB, sweating, odd hearbeats or different heart rate, nausea, vomiting, lightheadedness, or fainting lasting more than 5 minutes now or within the last hr. Spoke with Herbert Seta, RN in office. Agreed that pt should call 911. Advised pt; she was resistant to calling 911. Advised this to be the best protocol and why, pt stated she would go to ER; would not specify how. Protocol(s) Used: Chest Pain Recommended Outcome per Protocol: Activate EMS 911 Reason for Outcome: Chest discomfort associated with shortness of breath, sweating, odd heartbeats or different heart rate, nausea, vomiting, lightheadedness, or fainting lasting 5 or more minutes now or within the last hour Care Advice: ~ An adult should stay with the patient, preferably one trained in CPR. ~ IMMEDIATE ACTION Write down provider's name. List or place the following in a bag for transport with the patient: current prescription and/or nonprescription medications; alternative treatments, therapies and medications; and street drugs. ~ ~ Place person in a position of comfort and loosen tight clothing. After calling EMS 911, have the person chew one aspirin tablet (325 mg), or 4 baby aspirin (81mg ) with a small amount of water now if conscious, not allergic to aspirin, or if has not  been told to avoid taking aspirin by their provider. It is important to use aspirin, not acetaminophen. ~ Take nitroglycerin as directed if prescribed by your provider. Men should not take nitroglycerin within 36 hours of taking erectile dysfunction drugs unless directed to do so by their provider as it may cause a sudden drop in blood pressure. ~ ~ Tell providers if you are taking an erectile dysfunction drug such as Viagra(R), Cialis(R), Levitra(R). 08/10/2011 8:53:33AM Page 1 of 1 [id number]

## 2011-08-10 NOTE — Telephone Encounter (Signed)
Noted! Thank you

## 2011-08-10 NOTE — Telephone Encounter (Signed)
Please call to check on pt. 

## 2011-08-23 ENCOUNTER — Ambulatory Visit: Payer: Self-pay | Admitting: Surgery

## 2011-09-29 ENCOUNTER — Other Ambulatory Visit: Payer: Self-pay | Admitting: Family Medicine

## 2011-10-04 ENCOUNTER — Emergency Department: Payer: Self-pay | Admitting: Emergency Medicine

## 2011-10-04 LAB — CBC
HCT: 35.8 % (ref 35.0–47.0)
HGB: 12.4 g/dL (ref 12.0–16.0)
MCH: 32.8 pg (ref 26.0–34.0)
MCHC: 34.7 g/dL (ref 32.0–36.0)
RBC: 3.79 10*6/uL — ABNORMAL LOW (ref 3.80–5.20)

## 2011-10-04 LAB — COMPREHENSIVE METABOLIC PANEL
Alkaline Phosphatase: 83 U/L (ref 50–136)
Anion Gap: 5 — ABNORMAL LOW (ref 7–16)
BUN: 9 mg/dL (ref 7–18)
Bilirubin,Total: 0.5 mg/dL (ref 0.2–1.0)
Calcium, Total: 9.1 mg/dL (ref 8.5–10.1)
Chloride: 106 mmol/L (ref 98–107)
Co2: 28 mmol/L (ref 21–32)
EGFR (African American): >60
EGFR (Non-African Amer.): >60
Osmolality: 276 (ref 275–301)
Potassium: 3.8 mmol/L (ref 3.5–5.1)
SGOT(AST): 20 U/L (ref 15–37)
SGPT (ALT): 27 U/L
Sodium: 139 mmol/L (ref 136–145)
Total Protein: 8 g/dL (ref 6.4–8.2)

## 2011-10-04 LAB — URINALYSIS, COMPLETE
Ketone: NEGATIVE
Nitrite: NEGATIVE
Ph: 6 (ref 4.5–8.0)
Specific Gravity: 1.009 (ref 1.003–1.030)
WBC UR: 1201 /HPF (ref 0–5)

## 2011-10-04 LAB — PREGNANCY, URINE: Pregnancy Test, Urine: NEGATIVE m[IU]/mL

## 2011-10-04 LAB — LIPASE, BLOOD: Lipase: 82 U/L (ref 73–393)

## 2011-10-06 LAB — URINE CULTURE

## 2011-10-22 ENCOUNTER — Ambulatory Visit (INDEPENDENT_AMBULATORY_CARE_PROVIDER_SITE_OTHER): Payer: BC Managed Care – PPO | Admitting: Family Medicine

## 2011-10-22 ENCOUNTER — Encounter: Payer: Self-pay | Admitting: Family Medicine

## 2011-10-22 VITALS — BP 130/90 | HR 84 | Temp 98.9°F | Wt 240.0 lb

## 2011-10-22 DIAGNOSIS — J019 Acute sinusitis, unspecified: Secondary | ICD-10-CM

## 2011-10-22 MED ORDER — AMOXICILLIN-POT CLAVULANATE 875-125 MG PO TABS: 1.0000 | ORAL_TABLET | Freq: Two times a day (BID) | ORAL | Status: AC

## 2011-10-22 MED ORDER — ALPRAZOLAM 1 MG PO TABS: 1.0000 mg | ORAL_TABLET | Freq: Three times a day (TID) | ORAL | Status: DC | PRN

## 2011-10-22 NOTE — Progress Notes (Signed)
SUBJECTIVE:  Amber Frost is a 41 y.o. female who complains of coryza, congestion, sneezing, sore throat, bilateral sinus pain and fever for over 30 days, acutely worse past few days. She denies a history of anorexia, chest pain, nausea, shortness of breath, vomiting, weakness and weight loss and denies a history of asthma. Patient denies smoke cigarettes.   Patient Active Problem List  Diagnoses  . ANXIETY  . DEPRESSION  . HYPERTENSION  . LICHEN PLANUS  . Sinusitis acute   Past Medical History  Diagnosis Date  . Anxiety   . Depression   . Hypertension    Past Surgical History  Procedure Date  . Nasal sinus surgery   . Nasal sinus surgery    History  Substance Use Topics  . Smoking status: Never Smoker   . Smokeless tobacco: Not on file  . Alcohol Use: Yes   Family History  Problem Relation Age of Onset  . Diabetes Other   . Hypertension Other    No Known Allergies Current Outpatient Prescriptions on File Prior to Visit  Medication Sig Dispense Refill  . ALPRAZolam (XANAX) 1 MG tablet Take 1 tablet (1 mg total) by mouth 3 (three) times daily as needed.  60 tablet  0  . clobetasol cream (TEMOVATE) 0.05 % Apply 1 application topically at bedtime.  30 g  0  . lisinopril (PRINIVIL,ZESTRIL) 20 MG tablet TAKE 1 TABLET BY MOUTH EVERY DAY  30 tablet  3  . PRISTIQ 100 MG 24 hr tablet TAKE 1 TABLET BY MOUTH EVERY DAY  30 tablet  6   The PMH, PSH, Social History, Family History, Medications, and allergies have been reviewed in [facility], and have been updated if relevant.  OBJECTIVE: BP 130/90  Pulse 84  Temp(Src) 98.9 F (37.2 C) (Oral)  Wt 240 lb (108.863 kg)  She appears well, vital signs are as noted. Ears normal.  Throat and pharynx normal.  Neck supple. No adenopathy in the neck. Nose is congested. Sinuses  tender. The chest is clear, without wheezes or rales.  ASSESSMENT:  sinusitis  PLAN: Given duration and progression of symptoms, will treat for bacterial sinusitis  with Augmentin 1 tab twice daily x 10 days. Symptomatic therapy suggested: push fluids, rest and return office visit prn if symptoms persist or worsen.  Call or return to clinic prn if these symptoms worsen or fail to improve as anticipated.

## 2011-10-22 NOTE — Patient Instructions (Signed)
Take Augmentin as directed- 1 tablet twice daily x 10 days (with food).    Drink lots of fluids.  Treat sympotmatically with Mucinex, nasal saline irrigation, and Tylenol/Ibuprofen. Also try claritin D or zyrtec D over the counter- two times a day as needed ( have to sign for them at pharmacy). You can use warm compresses.  Cough suppressant at night. Call if not improving as expected in 5-7 days.

## 2011-12-07 ENCOUNTER — Other Ambulatory Visit: Payer: Self-pay | Admitting: *Deleted

## 2011-12-07 MED ORDER — ALPRAZOLAM 1 MG PO TABS: 1.0000 mg | ORAL_TABLET | Freq: Three times a day (TID) | ORAL | Status: DC | PRN

## 2011-12-07 NOTE — Telephone Encounter (Signed)
[facility]  [facility] pt [facility] as Amber Frost.

## 2011-12-07 NOTE — Telephone Encounter (Signed)
OK to refill

## 2011-12-10 ENCOUNTER — Encounter: Payer: Self-pay | Admitting: Family Medicine

## 2011-12-10 ENCOUNTER — Ambulatory Visit (INDEPENDENT_AMBULATORY_CARE_PROVIDER_SITE_OTHER): Payer: BC Managed Care – PPO | Admitting: Family Medicine

## 2011-12-10 VITALS — BP 160/90 | HR 90 | Temp 98.7°F | Wt 241.0 lb

## 2011-12-10 DIAGNOSIS — J019 Acute sinusitis, unspecified: Secondary | ICD-10-CM

## 2011-12-10 MED ORDER — AMOXICILLIN 875 MG PO TABS: 875.0000 mg | ORAL_TABLET | Freq: Two times a day (BID) | ORAL | Status: DC

## 2011-12-10 NOTE — Progress Notes (Signed)
Ear pain and ST for about 1 week.  No fevers but has felt hot.  Has some tender nodes in neck.  Gumline is sore.  No cough.  No wheeze.  Nonsmoker.   L sided upper back/chest pain and arm pain.  She was worried about a pinched nerve.  Started a few hours ago.  Had been going for massages but not recently.  Radicular sx, this has happened before.  No weakness.  H/o frequent muscle tightness in neck and upper back.    Meds, vitals, and allergies reviewed.   ROS: See HPI.  Otherwise, noncontributory.  GEN: nad, alert and oriented HEENT: mucous membranes moist, tm w/o erythema, nasal exam w/o erythema, clear discharge noted,  OP with cobblestoning, L max sinus ttp NECK: supple w/o LA but paraspinal muscles ttp L>R w/o midline pain CV: rrr.   PULM: ctab, no inc wob EXT: no edema SKIN: no acute rash

## 2011-12-10 NOTE — Assessment & Plan Note (Signed)
Nontoxic. Amoxil, supportive tx and f/u prn. The other sx are likely due to MSK source/muscle tightness and not an ominous dx. BP was elevated but she is off meds- she'll check BP at home and notify PCP.  She agrees with plan.

## 2011-12-10 NOTE — Patient Instructions (Addendum)
Start the amoxicillin and use nasal saline.  Get some rest and drink plenty of fluids.  Check your pressure outside of clinic and if consistently >140/>90, then notify Dr. Dayton Martes.

## 2012-01-02 ENCOUNTER — Other Ambulatory Visit: Payer: Self-pay | Admitting: Family Medicine

## 2012-02-15 ENCOUNTER — Other Ambulatory Visit: Payer: Self-pay | Admitting: *Deleted

## 2012-02-15 MED ORDER — ALPRAZOLAM 1 MG PO TABS: 1.0000 mg | ORAL_TABLET | Freq: Three times a day (TID) | ORAL | Status: DC | PRN

## 2012-02-15 NOTE — Telephone Encounter (Signed)
[facility] 

## 2012-02-15 NOTE — Telephone Encounter (Signed)
Received refill request from pharmacy. Last office visit 12/10/11, acute. Is it okay to refill medication?

## 2012-03-13 ENCOUNTER — Other Ambulatory Visit: Payer: Self-pay | Admitting: *Deleted

## 2012-03-13 MED ORDER — ALPRAZOLAM 1 MG PO TABS: 1.0000 mg | ORAL_TABLET | Freq: Three times a day (TID) | ORAL | Status: DC | PRN

## 2012-03-13 NOTE — Telephone Encounter (Signed)
Faxed refill request from [facility], last filled 02/15/12.

## 2012-03-14 NOTE — Telephone Encounter (Signed)
Medicine called to pharmacy. 

## 2012-03-29 IMAGING — US ABDOMEN ULTRASOUND LIMITED
1 series · 17 of 25 positions shown · non-contrast
Comparison: none

REASON FOR EXAM: epigastric pain
COMMENTS:   Body Site: GB and Fossa, CBD, Head of Pancreas

[Series 1: abdomen ultrasound limited · 17 of 51 slices shown]
[im 1/51]
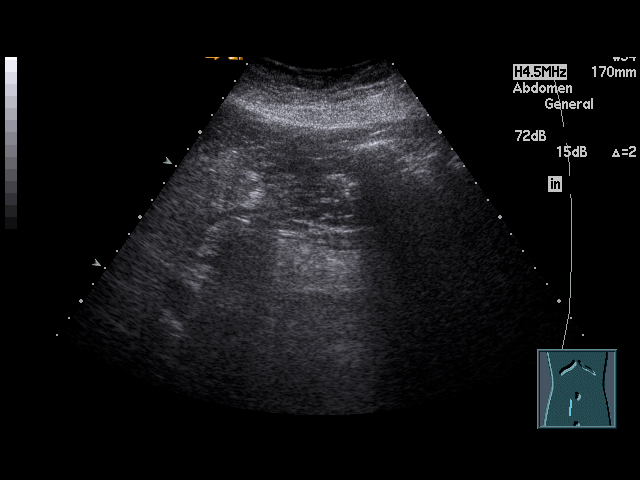
[im 5/51]
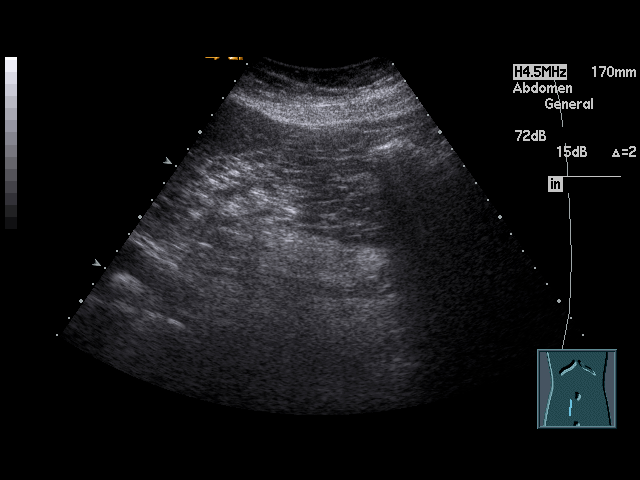
[im 7/51]
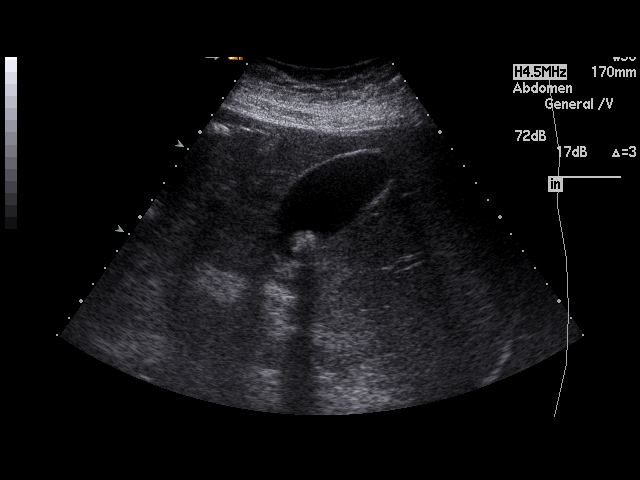
[im 11/51]
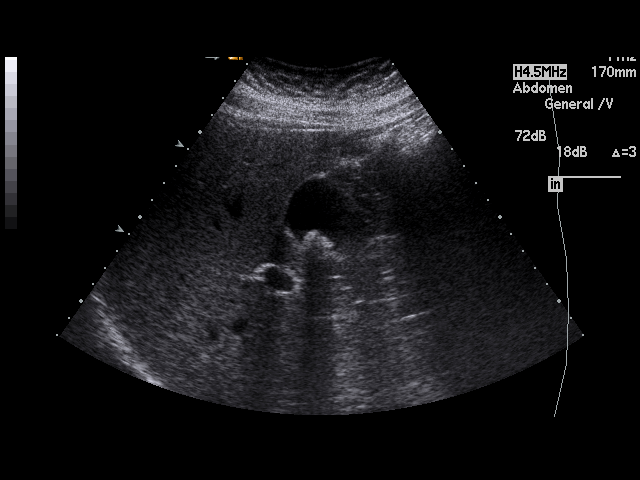
[im 13/51]
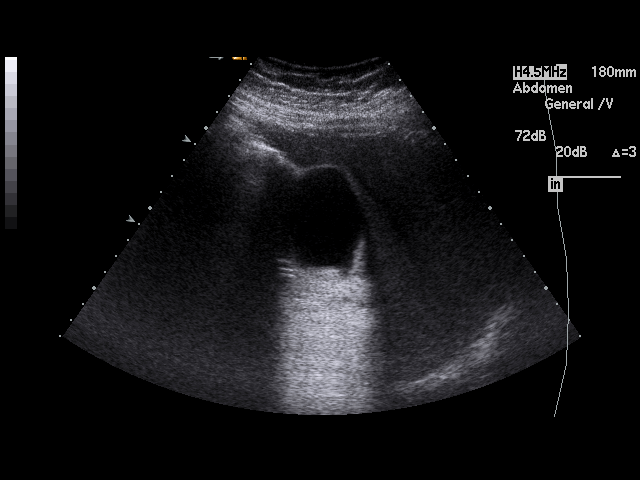
[im 17/51]
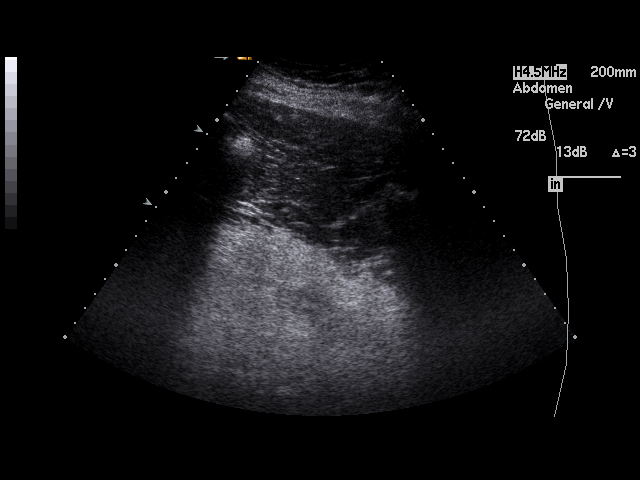
[im 19/51]
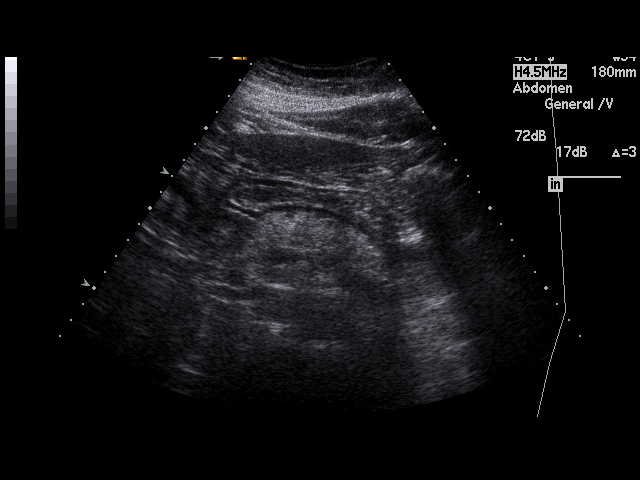
[im 23/51]
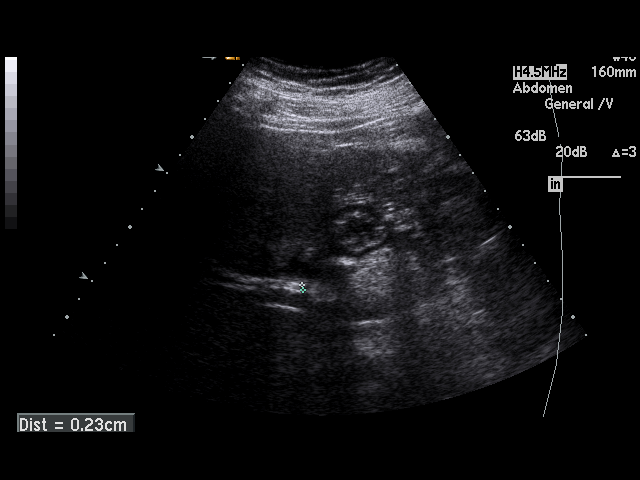
[im 26/51]
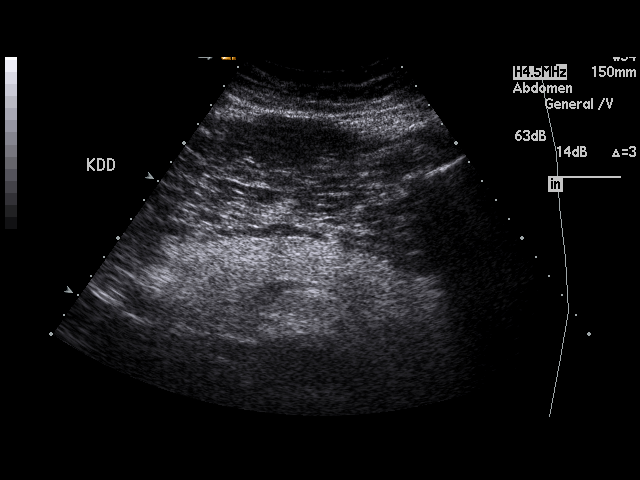
[im 28/51]
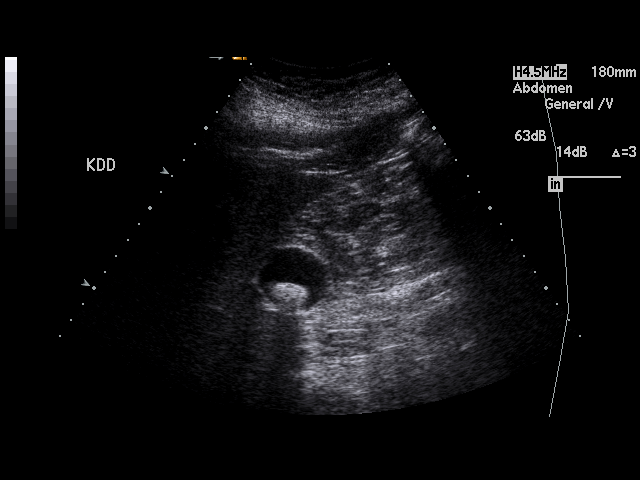
[im 32/51]
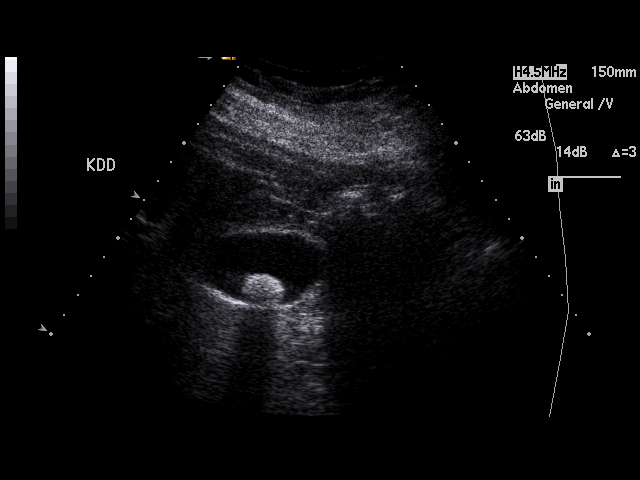
[im 34/51]
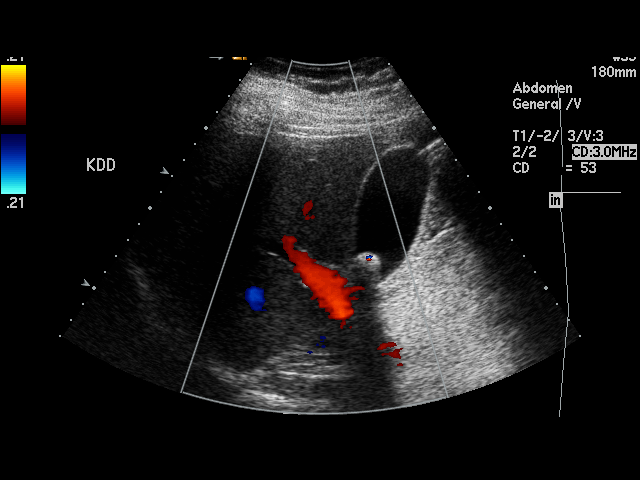
[im 38/51]
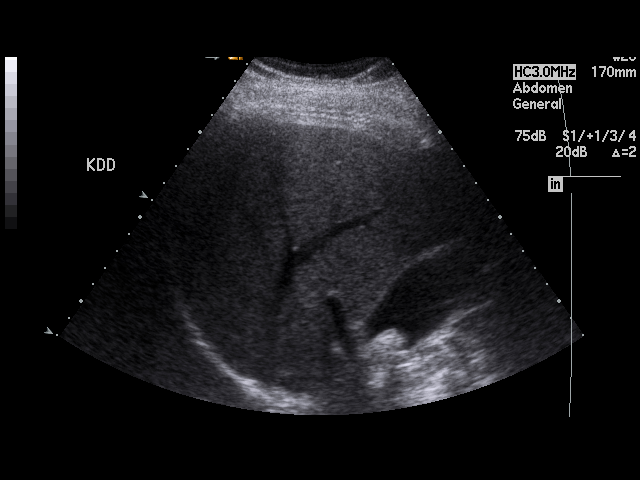
[im 40/51]
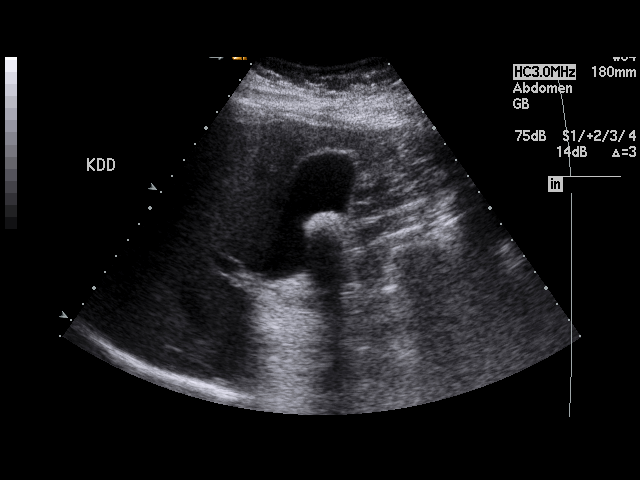
[im 44/51]
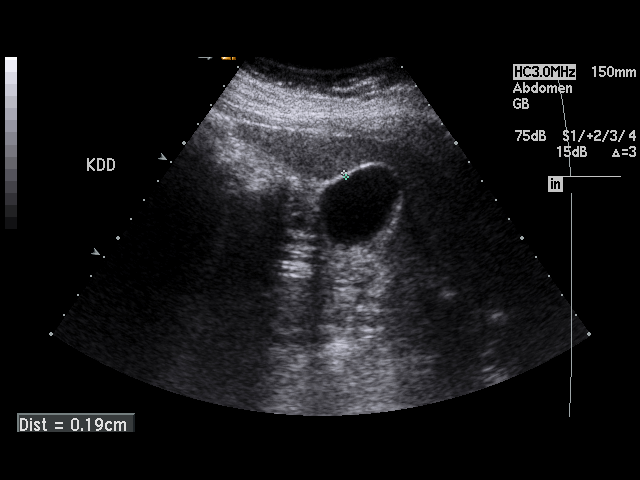
[im 46/51]
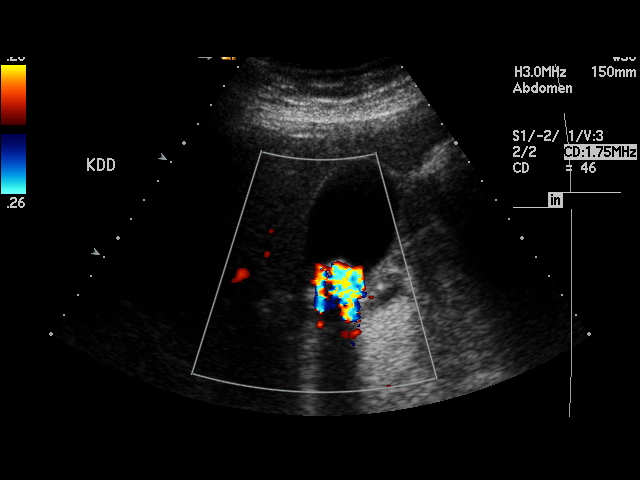
[im 51/51]
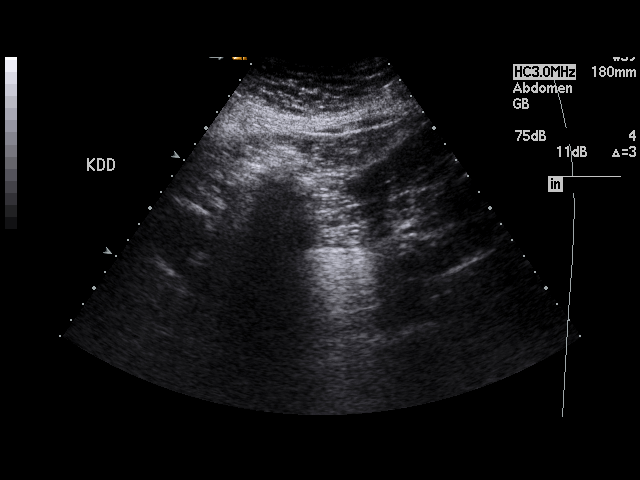

[17 of 25 positions shown; findings below may reference images not displayed]

PROCEDURE:     US  - US ABDOMEN LIMITED SURVEY  - August 10, 2011 [DATE]

RESULT:     Limited right upper quadrant abdominal sonogram shows limited
visualization of the pancreas because of overlying bowel gas. No gross
abnormality is evident. Cholelithiasis is demonstrated with at least one
large stone in the dependent portion. The gallbladder wall thickness is
mm. Common bile but diameters 4.0 mm. There is no pericholecystic fluid or
sonographic Murphy's sign. The visualized gallstone is mobile with changes
in patient position. No ascites is evident. The visualized liver is
unremarkable.
IMPRESSION: 1.1 cm stone in the gallbladder. No evidence of acute
cholecystitis.

## 2012-03-29 IMAGING — CR DG CHEST 2V
1 series · 2 of 2 positions shown · non-contrast
Comparison: none

REASON FOR EXAM: pain
COMMENTS:   May transport without cardiac monitor

[Series 1: w chest pa · 0.14mm/px · 2 of 2 slices shown]
[im 1/2]
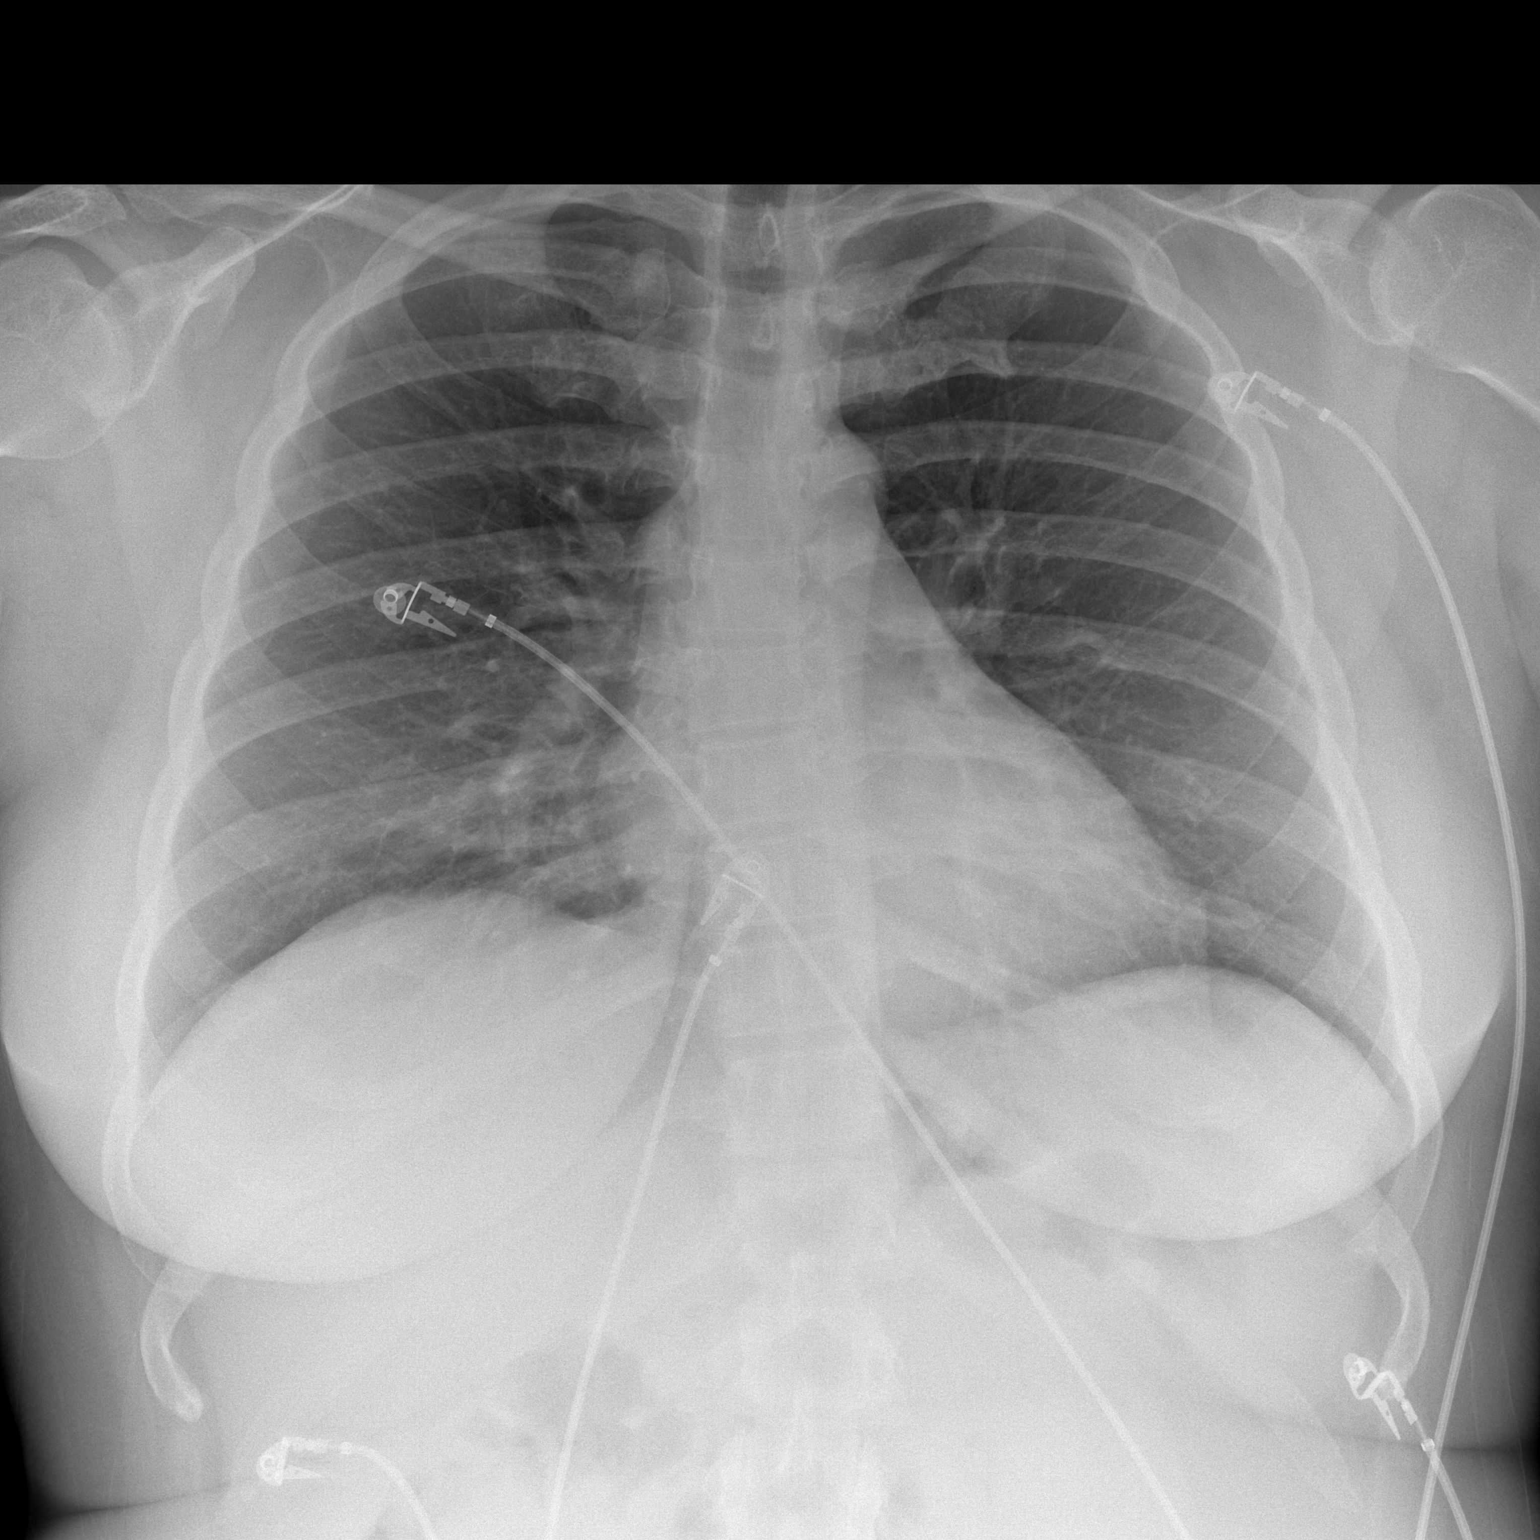
[im 2/2]
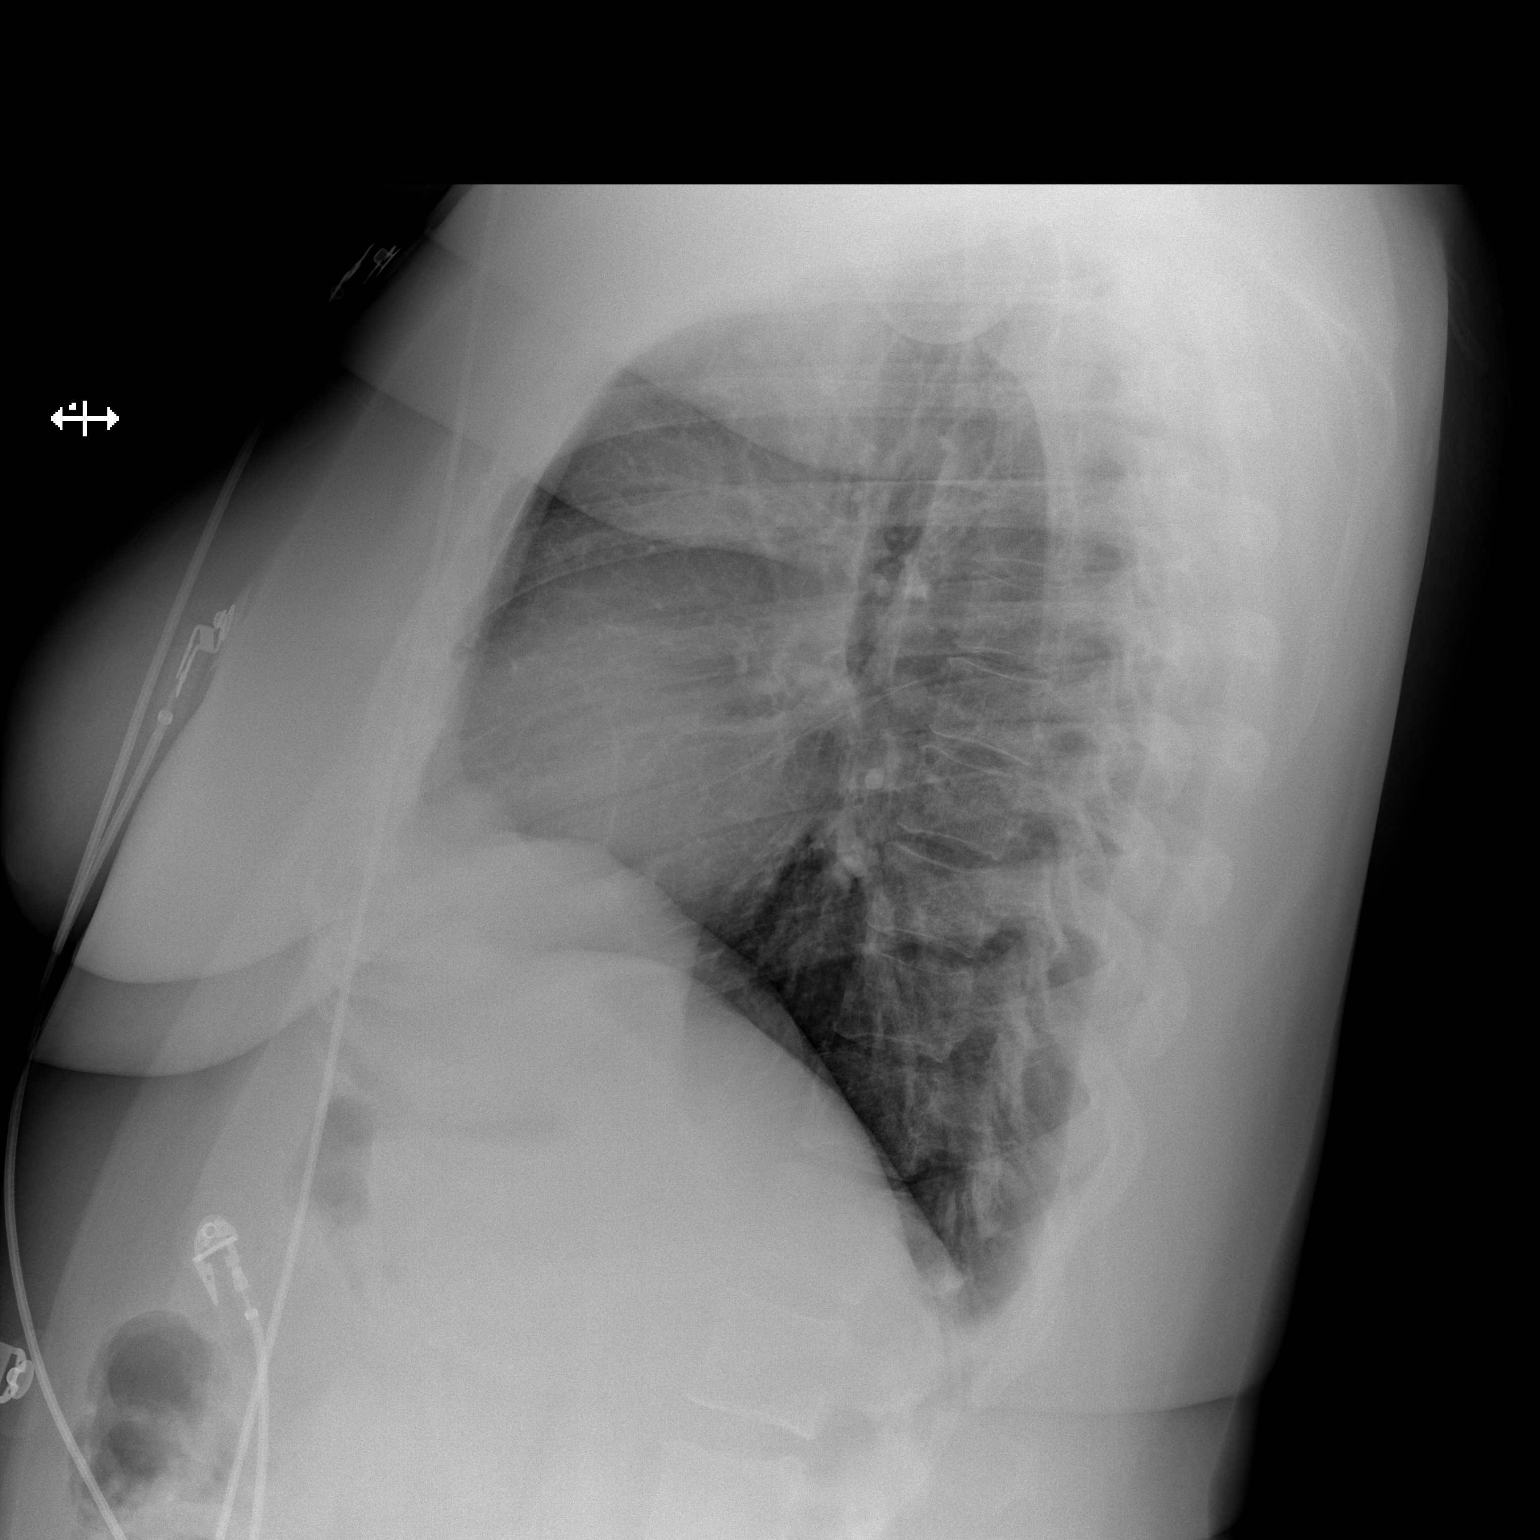

[2 of 2 positions shown; findings below may reference images not displayed]

PROCEDURE:     DXR - DXR CHEST PA (OR AP) AND LATERAL  - August 10, 2011 [DATE]

RESULT:     And there is no previous exam for comparison.

Cardiac monitoring electrodes are present. The lungs are clear. The heart
and pulmonary vessels are normal. The bony and mediastinal structures are
unremarkable. There is no effusion. There is no pneumothorax or evidence of
congestive failure.
IMPRESSION: No acute cardiopulmonary disease.

## 2012-04-26 ENCOUNTER — Other Ambulatory Visit: Payer: Self-pay | Admitting: Family Medicine

## 2012-04-26 DIAGNOSIS — Z136 Encounter for screening for cardiovascular disorders: Secondary | ICD-10-CM

## 2012-04-26 DIAGNOSIS — I10 Essential (primary) hypertension: Secondary | ICD-10-CM

## 2012-04-27 ENCOUNTER — Encounter: Payer: Self-pay | Admitting: Family Medicine

## 2012-04-27 ENCOUNTER — Ambulatory Visit (INDEPENDENT_AMBULATORY_CARE_PROVIDER_SITE_OTHER): Payer: BC Managed Care – PPO | Admitting: Family Medicine

## 2012-04-27 ENCOUNTER — Other Ambulatory Visit: Payer: BC Managed Care – PPO

## 2012-04-27 VITALS — BP 162/100 | HR 92 | Temp 98.0°F | Wt 239.0 lb

## 2012-04-27 DIAGNOSIS — J019 Acute sinusitis, unspecified: Secondary | ICD-10-CM

## 2012-04-27 DIAGNOSIS — I10 Essential (primary) hypertension: Secondary | ICD-10-CM

## 2012-04-27 MED ORDER — FLUTICASONE PROPIONATE 50 MCG/ACT NA SUSP: 2.0000 | Freq: Every day | NASAL | Status: DC

## 2012-04-27 MED ORDER — ALPRAZOLAM 1 MG PO TABS: 1.0000 mg | ORAL_TABLET | Freq: Three times a day (TID) | ORAL | Status: DC | PRN

## 2012-04-27 MED ORDER — LISINOPRIL 20 MG PO TABS: 20.0000 mg | ORAL_TABLET | Freq: Every day | ORAL | Status: DC

## 2012-04-27 MED ORDER — AMOXICILLIN 875 MG PO TABS: 875.0000 mg | ORAL_TABLET | Freq: Two times a day (BID) | ORAL | Status: AC

## 2012-04-27 NOTE — Patient Instructions (Addendum)
Start the amoxicillin and use nasal saline.  Get some rest and drink plenty of fluids.   Restart your lisinopril.  Start Flonase.

## 2012-04-27 NOTE — Progress Notes (Signed)
SUBJECTIVE:  Amber Frost is a 41 y.o. female who complains of coryza, congestion, sneezing, sore throat, bilateral sinus pain, ear pain and fever for over 30 days, acutely worse past few days. She denies a history of anorexia, chest pain, nausea, shortness of breath, vomiting, weakness and weight loss and denies a history of asthma. Patient denies smoke cigarettes.  HTN- previously was taking Lisinopril, it was stopped in hospital after her surgery because she was normotensive. She has been feeling like her blood pressure has been elevated- slight HA, dizziness.  No blurred vision.   Patient Active Problem List  Diagnosis  . ANXIETY  . DEPRESSION  . HYPERTENSION  . LICHEN PLANUS  . Sinusitis acute   Past Medical History  Diagnosis Date  . Anxiety   . Depression   . Hypertension    Past Surgical History  Procedure Date  . Nasal sinus surgery   . Nasal sinus surgery    History  Substance Use Topics  . Smoking status: Never Smoker   . Smokeless tobacco: Never Used  . Alcohol Use: Yes   Family History  Problem Relation Age of Onset  . Diabetes Other   . Hypertension Other    No Known Allergies Current Outpatient Prescriptions on File Prior to Visit  Medication Sig Dispense Refill  . PRISTIQ 100 MG 24 hr tablet TAKE 1 TABLET BY MOUTH EVERY DAY  30 tablet  5  . fluticasone (FLONASE) 50 MCG/ACT nasal spray Place 2 sprays into the nose daily.  16 g  6   The PMH, PSH, Social History, Family History, Medications, and allergies have been reviewed in [facility], and have been updated if relevant.  OBJECTIVE: BP 162/100  Pulse 92  Temp 98 F (36.7 C)  Wt 239 lb (108.41 kg)  She appears well, vital signs are as noted. Left TM injected, dull, Right TM dull. Throat and pharynx normal.  Neck supple. No adenopathy in the neck. Nose is congested. Sinuses  tender. The chest is clear, without wheezes or rales.  ASSESSMENT/PLAN:   1. HYPERTENSION  Deteriorated.  Restart Lisinopril- rx sent  to her pharmacy.  She is following up with me for CPX next week.  2. Sinusitis, acute  Given duration and progression of symptoms, will treat for bacterial sinusitis/otitis with Amoxicillin 875 mg twice daily x 10 days. Start flonase.  Advised avoiding medications with decongestants given her high blood pressure. The patient indicates understanding of these issues and agrees with the plan.

## 2012-05-02 ENCOUNTER — Encounter: Payer: BC Managed Care – PPO | Admitting: Family Medicine

## 2012-05-09 ENCOUNTER — Encounter: Payer: Self-pay | Admitting: Family Medicine

## 2012-05-09 ENCOUNTER — Ambulatory Visit (INDEPENDENT_AMBULATORY_CARE_PROVIDER_SITE_OTHER): Payer: BC Managed Care – PPO | Admitting: Family Medicine

## 2012-05-09 ENCOUNTER — Other Ambulatory Visit (HOSPITAL_COMMUNITY)
Admission: RE | Admit: 2012-05-09 | Discharge: 2012-05-09 | Disposition: A | Discharge status: Home / Self Care | Payer: BC Managed Care – PPO | Source: Ambulatory Visit | Attending: Family Medicine | Admitting: Family Medicine

## 2012-05-09 VITALS — BP 142/98 | HR 80 | Temp 98.3°F | Ht 64.25 in | Wt 238.0 lb

## 2012-05-09 DIAGNOSIS — Z113 Encounter for screening for infections with a predominantly sexual mode of transmission: Secondary | ICD-10-CM

## 2012-05-09 DIAGNOSIS — F329 Major depressive disorder, single episode, unspecified: Secondary | ICD-10-CM

## 2012-05-09 DIAGNOSIS — N920 Excessive and frequent menstruation with regular cycle: Secondary | ICD-10-CM

## 2012-05-09 DIAGNOSIS — I10 Essential (primary) hypertension: Secondary | ICD-10-CM

## 2012-05-09 DIAGNOSIS — F3289 Other specified depressive episodes: Secondary | ICD-10-CM

## 2012-05-09 DIAGNOSIS — Z01419 Encounter for gynecological examination (general) (routine) without abnormal findings: Secondary | ICD-10-CM | POA: Insufficient documentation

## 2012-05-09 DIAGNOSIS — J329 Chronic sinusitis, unspecified: Secondary | ICD-10-CM

## 2012-05-09 DIAGNOSIS — Z136 Encounter for screening for cardiovascular disorders: Secondary | ICD-10-CM

## 2012-05-09 DIAGNOSIS — Z0000 Encounter for general adult medical examination without abnormal findings: Secondary | ICD-10-CM

## 2012-05-09 DIAGNOSIS — Z1231 Encounter for screening mammogram for malignant neoplasm of breast: Secondary | ICD-10-CM

## 2012-05-09 DIAGNOSIS — F411 Generalized anxiety disorder: Secondary | ICD-10-CM

## 2012-05-09 LAB — COMPREHENSIVE METABOLIC PANEL WITH GFR
ALT: 20 U/L (ref 0–35)
AST: 19 U/L (ref 0–37)
Albumin: 4.1 g/dL (ref 3.5–5.2)
Alkaline Phosphatase: 57 U/L (ref 39–117)
BUN: 12 mg/dL (ref 6–23)
CO2: 23 meq/L (ref 19–32)
Calcium: 8.9 mg/dL (ref 8.4–10.5)
Chloride: 104 meq/L (ref 96–112)
Creatinine, Ser: 0.7 mg/dL (ref 0.4–1.2)
GFR: 93.4 mL/min
Glucose, Bld: 107 mg/dL — ABNORMAL HIGH (ref 70–99)
Potassium: 4 meq/L (ref 3.5–5.1)
Sodium: 135 meq/L (ref 135–145)
Total Bilirubin: 0.9 mg/dL (ref 0.3–1.2)
Total Protein: 7.3 g/dL (ref 6.0–8.3)

## 2012-05-09 LAB — LIPID PANEL
Cholesterol: 187 mg/dL (ref 0–200)
Triglycerides: 159 mg/dL — ABNORMAL HIGH (ref 0.0–149.0)

## 2012-05-09 MED ORDER — NORETHIN-ETH ESTRAD-FE BIPHAS 1 MG-10 MCG / 10 MCG PO TABS: 1.0000 | ORAL_TABLET | Freq: Every day | ORAL | Status: DC

## 2012-05-09 NOTE — Patient Instructions (Addendum)
Good to see you. We will call you with your lab results. Please stop by to see Amber Frost on your way out to set up your mammogram and ENT referral.

## 2012-05-09 NOTE — Progress Notes (Signed)
Subjective:    Patient ID: Amber Frost, female    DOB: [birth year], 41 y.o.   MRN: [id number]  HPI  41 yo here for CPX.  Last pap smear was in 08/2009 but she would like her pap smear today. No h/o abnormal pap smears but maternal grandmother died of uterine (not sure if was cervical) CA in her early 76s.  Periods are very heavy and irregular.  She could not tolerate previous OCPs with a lot of estrogen.  She is a non smoker.    She would like to consider another OCP.  Sinusitis- symptoms a little improved s/p Augmentin but feels she has too many sinus infections.  She is using her flonase.  She would like to see an ENT.  HTN- BP improved since she restart lisinopril.  No CP, SOB, blurred vision, or HA.  Patient Active Problem List  Diagnosis  . ANXIETY  . DEPRESSION  . HYPERTENSION  . LICHEN PLANUS  . Sinusitis acute  . Routine general medical examination at a health care facility  . Menorrhagia   Past Medical History  Diagnosis Date  . Anxiety   . Depression   . Hypertension    Past Surgical History  Procedure Date  . Nasal sinus surgery   . Nasal sinus surgery    History  Substance Use Topics  . Smoking status: Never Smoker   . Smokeless tobacco: Never Used  . Alcohol Use: Yes   Family History  Problem Relation Age of Onset  . Diabetes Other   . Hypertension Other    No Known Allergies Current Outpatient Prescriptions on File Prior to Visit  Medication Sig Dispense Refill  . ALPRAZolam (XANAX) 1 MG tablet Take 1 tablet (1 mg total) by mouth 3 (three) times daily as needed.  60 tablet  0  . fluticasone (FLONASE) 50 MCG/ACT nasal spray Place 2 sprays into the nose daily.  16 g  6  . lisinopril (PRINIVIL,ZESTRIL) 20 MG tablet Take 1 tablet (20 mg total) by mouth daily.  30 tablet  3  . PRISTIQ 100 MG 24 hr tablet TAKE 1 TABLET BY MOUTH EVERY DAY  30 tablet  5  . Norethindrone-Ethinyl Estradiol-Fe Biphas (LO LOESTRIN FE) 1 MG-10 MCG / 10 MCG tablet Take 1 tablet  by mouth daily.  1 Package  11   The PMH, PSH, Social History, Family History, Medications, and allergies have been reviewed in [facility], and have been updated if relevant.   Review of Systems See HPI Patient reports no  vision/ hearing changes,anorexia, weight change, fever ,adenopathy, persistant / recurrent hoarseness, swallowing issues, chest pain, edema,persistant / recurrent cough, hemoptysis, dyspnea(rest, exertional, paroxysmal nocturnal), gastrointestinal  bleeding (melena, rectal bleeding), abdominal pain, excessive heart burn, GU symptoms(dysuria, hematuria, pyuria, voiding/incontinence  Issues) syncope, focal weakness, severe memory loss, concerning skin lesions, depression, anxiety, abnormal bruising/bleeding, major joint swelling, breast masses or abnormal vaginal bleeding.       Objective:   Physical Exam BP 142/98  Pulse 80  Temp 98.3 F (36.8 C)  Ht 5' 4.25" (1.632 m)  Wt 238 lb (107.956 kg)  BMI 40.54 kg/m2  General:  Obese ,well-nourished,in no acute distress; alert,appropriate and cooperative throughout examination Head:  normocephalic and atraumatic.   Eyes:  vision grossly intact, pupils equal, pupils round, and pupils reactive to light.   Ears:  R ear normal and L ear normal.   Nose:  no external deformity.   Mouth:  good dentition.   Neck:  No deformities,  masses, or tenderness noted. Breasts:  No mass, nodules, thickening, tenderness, bulging, retraction, inflamation, nipple discharge or skin changes noted.   Lungs:  Normal respiratory effort, chest expands symmetrically. Lungs are clear to auscultation, no crackles or wheezes. Heart:  Normal rate and regular rhythm. S1 and S2 normal without gallop, murmur, click, rub or other extra sounds. Abdomen:  Bowel sounds positive,abdomen soft and non-tender without masses, organomegaly or hernias noted. Rectal:  no external abnormalities.   Genitalia:  Pelvic Exam:        External: normal female genitalia without lesions  or masses        Vagina: normal without lesions or masses        Cervix: normal without lesions or masses        Adnexa: normal bimanual exam without masses or fullness        Uterus: normal by palpation        Pap smear: performed Msk:  No deformity or scoliosis noted of thoracic or lumbar spine.   Extremities:  No clubbing, cyanosis, edema, or deformity noted with normal full range of motion of all joints.   Neurologic:  alert & oriented X3 and gait normal.   Skin:  Intact without suspicious lesions or rashes Cervical Nodes:  No lymphadenopathy noted Axillary Nodes:  No palpable lymphadenopathy Psych:  Cognition and judgment appear intact. Alert and cooperative with normal attention span and concentration. No apparent delusions, illusions, hallucinations     Assessment & Plan:   1. Routine general medical examination at a health care facility  Reviewed preventive care protocols, scheduled due services, and updated immunizations Discussed nutrition, exercise, diet, and healthy lifestyle.  Comprehensive metabolic panel, Cytology - PAP  2. HYPERTENSION  Stable with lisinopril.   3. DEPRESSION  On pristiq.   4. Recurrent sinusitis Refer to ENT.  Ambulatory referral to ENT  5. Other screening mammogram  MM Digital Screening  6. Screening for ischemic heart disease  Lipid Panel  7. Menorrhagia  Deteriorated.  Start Lo loestrin. Follow up in 2 months.   8. Screening for STD (sexually transmitted disease)  Cytology - PAP, HIV Antibody, RPR

## 2012-05-10 ENCOUNTER — Ambulatory Visit: Payer: Self-pay | Admitting: Family Medicine

## 2012-05-12 ENCOUNTER — Encounter: Payer: Self-pay | Admitting: Family Medicine

## 2012-05-12 LAB — HM PAP SMEAR: HM Pap smear: NORMAL

## 2012-06-13 ENCOUNTER — Other Ambulatory Visit: Payer: Self-pay | Admitting: *Deleted

## 2012-06-13 MED ORDER — ALPRAZOLAM 1 MG PO TABS: 1.0000 mg | ORAL_TABLET | Freq: Three times a day (TID) | ORAL | Status: DC | PRN

## 2012-06-13 NOTE — Telephone Encounter (Signed)
Faxed refill request from [facility], last filled 03/14/12.

## 2012-06-13 NOTE — Telephone Encounter (Signed)
[facility] 

## 2012-07-22 ENCOUNTER — Other Ambulatory Visit: Payer: Self-pay | Admitting: Family Medicine

## 2012-07-28 ENCOUNTER — Other Ambulatory Visit: Payer: Self-pay | Admitting: *Deleted

## 2012-07-28 MED ORDER — ALPRAZOLAM 1 MG PO TABS: 1.0000 mg | ORAL_TABLET | Freq: Three times a day (TID) | ORAL | Status: DC | PRN

## 2012-07-28 NOTE — Telephone Encounter (Signed)
[facility] 

## 2012-07-28 NOTE — Telephone Encounter (Signed)
Last filled 06/13/2012 

## 2012-08-26 ENCOUNTER — Other Ambulatory Visit: Payer: Self-pay | Admitting: Family Medicine

## 2012-09-05 ENCOUNTER — Telehealth: Payer: Self-pay | Admitting: Family Medicine

## 2012-09-05 MED ORDER — ALPRAZOLAM 1 MG PO TABS: 1.0000 mg | ORAL_TABLET | Freq: Three times a day (TID) | ORAL | Status: DC | PRN

## 2012-09-05 NOTE — Telephone Encounter (Signed)
Ok to refill one month supply, no refills.

## 2012-09-05 NOTE — Telephone Encounter (Signed)
Refill request for Alprazolam.  Last filled 07/28/12.

## 2012-09-05 NOTE — Telephone Encounter (Signed)
[facility] 

## 2012-09-22 ENCOUNTER — Ambulatory Visit: Payer: Self-pay | Admitting: Unknown Physician Specialty

## 2012-10-05 ENCOUNTER — Other Ambulatory Visit: Payer: Self-pay | Admitting: Family Medicine

## 2012-10-06 NOTE — Telephone Encounter (Signed)
[facility] 

## 2012-10-26 ENCOUNTER — Other Ambulatory Visit: Payer: Self-pay | Admitting: Family Medicine

## 2012-10-30 ENCOUNTER — Other Ambulatory Visit: Payer: Self-pay | Admitting: Family Medicine

## 2012-10-31 NOTE — Telephone Encounter (Signed)
[facility] 

## 2012-11-23 ENCOUNTER — Other Ambulatory Visit: Payer: Self-pay | Admitting: Family Medicine

## 2012-11-24 NOTE — Telephone Encounter (Signed)
[facility] 

## 2012-12-17 ENCOUNTER — Other Ambulatory Visit: Payer: Self-pay | Admitting: Family Medicine

## 2013-01-17 ENCOUNTER — Other Ambulatory Visit: Payer: Self-pay | Admitting: Family Medicine

## 2013-01-17 NOTE — Telephone Encounter (Signed)
pristiq sent, call in xanax.  Thanks.

## 2013-01-25 ENCOUNTER — Other Ambulatory Visit: Payer: Self-pay | Admitting: Family Medicine

## 2013-01-25 NOTE — Telephone Encounter (Signed)
This was approved on 01/17/13 for #[id number], but it wasn't called in.  [facility]

## 2013-02-24 ENCOUNTER — Other Ambulatory Visit: Payer: Self-pay | Admitting: Family Medicine

## 2013-03-04 ENCOUNTER — Other Ambulatory Visit: Payer: Self-pay | Admitting: Family Medicine

## 2013-03-04 NOTE — Telephone Encounter (Signed)
Last office visit 05/09/2012. Ok to refill?

## 2013-03-05 NOTE — Telephone Encounter (Signed)
Called to pharmacy 

## 2013-04-05 ENCOUNTER — Other Ambulatory Visit: Payer: Self-pay | Admitting: Family Medicine

## 2013-04-21 ENCOUNTER — Other Ambulatory Visit: Payer: Self-pay | Admitting: Family Medicine

## 2013-04-23 NOTE — Telephone Encounter (Signed)
Last office visit 05/09/2012. Ok to refill? 

## 2013-04-23 NOTE — Telephone Encounter (Signed)
Ok to phone in but will need OV before further refills provided

## 2013-04-23 NOTE — Telephone Encounter (Signed)
[facility] [address] 

## 2013-05-20 ENCOUNTER — Other Ambulatory Visit: Payer: Self-pay | Admitting: Family Medicine

## 2013-05-20 NOTE — Telephone Encounter (Signed)
Last office visit 05/09/2012. Ok to refill? 

## 2013-05-21 ENCOUNTER — Other Ambulatory Visit: Payer: Self-pay

## 2013-05-21 ENCOUNTER — Other Ambulatory Visit: Payer: Self-pay | Admitting: *Deleted

## 2013-05-21 ENCOUNTER — Telehealth: Payer: Self-pay

## 2013-05-21 MED ORDER — DESVENLAFAXINE SUCCINATE ER 100 MG PO TB24: ORAL_TABLET | ORAL | Status: DC

## 2013-05-21 NOTE — Telephone Encounter (Signed)
Give 30 days supply, will need OV for further refills

## 2013-05-21 NOTE — Telephone Encounter (Signed)
Left message on voicemail letting pt know that a 30 day supply Pristiq will be sent to pharmacy but she will need to make an appt for an OV for anymore refills

## 2013-05-21 NOTE — Telephone Encounter (Signed)
Patient has scheduled a CPX with Nicki Reaper for 05/25/2013 @ 2:30 pm.

## 2013-05-21 NOTE — Telephone Encounter (Deleted)
Left message on voicemail letting pt know that a 30 day supply Pristiq will be sent to pharmacy but she will need to make an appt for an OV for anymore refills 

## 2013-05-21 NOTE — Telephone Encounter (Signed)
Rx sent through [facility] Left detailed message on voicemail letting pt know she needed to make an OV appt for anymore refills

## 2013-05-22 ENCOUNTER — Other Ambulatory Visit: Payer: Self-pay | Admitting: Internal Medicine

## 2013-05-22 ENCOUNTER — Other Ambulatory Visit (INDEPENDENT_AMBULATORY_CARE_PROVIDER_SITE_OTHER): Payer: BC Managed Care – PPO

## 2013-05-22 DIAGNOSIS — Z0000 Encounter for general adult medical examination without abnormal findings: Secondary | ICD-10-CM

## 2013-05-22 LAB — COMPREHENSIVE METABOLIC PANEL
ALT: 31 U/L (ref 0–35)
AST: 26 U/L (ref 0–37)
Albumin: 4.2 g/dL (ref 3.5–5.2)
Alkaline Phosphatase: 50 U/L (ref 39–117)
BUN: 13 mg/dL (ref 6–23)
Calcium: 8.9 mg/dL (ref 8.4–10.5)
Chloride: 106 mEq/L (ref 96–112)
Potassium: 4.1 mEq/L (ref 3.5–5.1)
Sodium: 136 mEq/L (ref 135–145)
Total Protein: 7.3 g/dL (ref 6.0–8.3)

## 2013-05-22 LAB — CBC
Hemoglobin: 13.8 g/dL (ref 12.0–15.0)
MCHC: 33.9 g/dL (ref 30.0–36.0)
Platelets: 223 10*3/uL (ref 150.0–400.0)
WBC: 5.6 10*3/uL (ref 4.5–10.5)

## 2013-05-22 LAB — LIPID PANEL
Cholesterol: 185 mg/dL (ref 0–200)
HDL: 52.2 mg/dL (ref >39.00)
VLDL: 29 mg/dL (ref 0.0–40.0)

## 2013-05-25 ENCOUNTER — Encounter: Payer: Self-pay | Admitting: Radiology

## 2013-05-25 ENCOUNTER — Other Ambulatory Visit (HOSPITAL_COMMUNITY)
Admission: RE | Admit: 2013-05-25 | Discharge: 2013-05-25 | Disposition: A | Discharge status: Home / Self Care | Payer: BC Managed Care – PPO | Source: Ambulatory Visit | Attending: Internal Medicine | Admitting: Internal Medicine

## 2013-05-25 ENCOUNTER — Encounter: Payer: Self-pay | Admitting: Internal Medicine

## 2013-05-25 ENCOUNTER — Ambulatory Visit (INDEPENDENT_AMBULATORY_CARE_PROVIDER_SITE_OTHER): Payer: BC Managed Care – PPO | Admitting: Internal Medicine

## 2013-05-25 VITALS — BP 138/92 | HR 111 | Temp 98.4°F | Ht 64.0 in | Wt 246.5 lb

## 2013-05-25 DIAGNOSIS — Z1151 Encounter for screening for human papillomavirus (HPV): Secondary | ICD-10-CM | POA: Insufficient documentation

## 2013-05-25 DIAGNOSIS — I10 Essential (primary) hypertension: Secondary | ICD-10-CM

## 2013-05-25 DIAGNOSIS — J019 Acute sinusitis, unspecified: Secondary | ICD-10-CM

## 2013-05-25 DIAGNOSIS — Z01419 Encounter for gynecological examination (general) (routine) without abnormal findings: Secondary | ICD-10-CM | POA: Insufficient documentation

## 2013-05-25 DIAGNOSIS — K219 Gastro-esophageal reflux disease without esophagitis: Secondary | ICD-10-CM

## 2013-05-25 DIAGNOSIS — F411 Generalized anxiety disorder: Secondary | ICD-10-CM

## 2013-05-25 DIAGNOSIS — Z0000 Encounter for general adult medical examination without abnormal findings: Secondary | ICD-10-CM

## 2013-05-25 MED ORDER — ALPRAZOLAM 1 MG PO TABS: ORAL_TABLET | ORAL | Status: DC

## 2013-05-25 MED ORDER — LISINOPRIL 20 MG PO TABS: ORAL_TABLET | ORAL | Status: DC

## 2013-05-25 MED ORDER — NORETHIN-ETH ESTRAD-FE BIPHAS 1 MG-10 MCG / 10 MCG PO TABS
ORAL_TABLET | ORAL | Status: DC
Start: 2013-05-25 — End: 2014-05-01

## 2013-05-25 MED ORDER — LEVOFLOXACIN 500 MG PO TABS: 500.0000 mg | ORAL_TABLET | Freq: Every day | ORAL | Status: DC

## 2013-05-25 MED ORDER — DESVENLAFAXINE SUCCINATE ER 100 MG PO TB24: ORAL_TABLET | ORAL | Status: DC

## 2013-05-25 MED ORDER — PANTOPRAZOLE SODIUM 40 MG PO TBEC: 40.0000 mg | DELAYED_RELEASE_TABLET | Freq: Every day | ORAL | Status: DC

## 2013-05-25 NOTE — Assessment & Plan Note (Signed)
Well controlled Continue current therapy 

## 2013-05-25 NOTE — Progress Notes (Signed)
Pre-visit discussion using our clinic review tool. No additional management support is needed unless otherwise documented below in the visit note.  

## 2013-05-25 NOTE — Assessment & Plan Note (Signed)
Stop zantac Will start protonix

## 2013-05-25 NOTE — Patient Instructions (Signed)

## 2013-05-25 NOTE — Progress Notes (Signed)
Subjective:    Patient ID: Amber Frost, female    DOB: [birth year], 42 y.o.   MRN: [id number]  HPI  Pt presents to the clinic today for her annual exam. She does have some concerns about her sinuses. She has recurrent sinusitis. She did have a septoplasty with turbinate reduction in 09/2012. She has not had a sinus infection since that time. She started having headaches, nasal congestion, teeth pain, ear pain and fatigue about 3 weeks ago. It started out as a cold and has now progressed into her head. She has only taken her allergy medication OTC. She has had sick contacts. Additionally, she c/o epigastric pain and reflux. This occurs multiple times per week despite her taking zantac. She has ntoobeen treated for GERD in the past. She denies chest pain, chest tightness, shortness of breath or cough.   Flu: 02/2013 Tetanus: 2010 LMP: none since being on birth control Pap Smear: 2013 Mammogram: 2013 (has one scheduled for 06/2013) Eye Doctor: as needed Dentist: yearly   Review of Systems  Past Medical History  Diagnosis Date  . Anxiety   . Depression   . Hypertension     Current Outpatient Prescriptions  Medication Sig Dispense Refill  . ALPRAZolam (XANAX) 1 MG tablet TAKE 1 TABLET BY MOUTH 3 TIMES A DAY AS NEEDED  60 tablet  0  . desvenlafaxine (PRISTIQ) 100 MG 24 hr tablet TAKE 1 TABLET BY MOUTH EVERY DAY  30 tablet  0  . fluticasone (FLONASE) 50 MCG/ACT nasal spray Place 2 sprays into the nose daily.  16 g  6  . lisinopril (PRINIVIL,ZESTRIL) 20 MG tablet TAKE 1 TABLET (20 MG TOTAL) BY MOUTH DAILY.  90 tablet  1  . LO LOESTRIN FE 1 MG-10 MCG / 10 MCG tablet TAKE 1 TABLET BY MOUTH DAILY.  28 tablet  11   No current facility-administered medications for this visit.    No Known Allergies  Family History  Problem Relation Age of Onset  . Diabetes Other   . Hypertension Other     History   Social History  . Marital Status: Married    Spouse Name: N/A    Number of  Children: 1  . Years of Education: N/A   Occupational History  . Director of Education    Social History Main Topics  . Smoking status: Never Smoker   . Smokeless tobacco: Never Used  . Alcohol Use: Yes  . Drug Use: No  . Sexual Activity: Not on file   Other Topics Concern  . Not on file   Social History Narrative  . No narrative on file     Constitutional: Denies fever, malaise, fatigue, or abrupt weight changes.  HEENT: Denies eye pain, eye redness, ringing in the ears, wax buildup, runny nose, bloody nose, or sore throat. Respiratory: Denies difficulty breathing, shortness of breath, cough or sputum production.   Cardiovascular: Denies chest pain, chest tightness, palpitations or swelling in the hands or feet.  Gastrointestinal: Denies abdominal pain, bloating, constipation, diarrhea or blood in the stool.  GU: Denies urgency, frequency, pain with urination, burning sensation, blood in urine, odor or discharge. Musculoskeletal: Denies decrease in range of motion, difficulty with gait, muscle pain or joint pain and swelling.  Skin: Denies redness, rashes, lesions or ulcercations.  Neurological: Denies dizziness, difficulty with memory, difficulty with speech or problems with balance and coordination.   No other specific complaints in a complete review of systems (except as listed in HPI above).  Objective:   Physical Exam   BP 138/92  Pulse 111  Temp(Src) 98.4 F (36.9 C) (Tympanic)  Ht 5\' 4"  (1.626 m)  Wt 246 lb 8 oz (111.812 kg)  BMI 42.29 kg/m2  SpO2 95% Wt Readings from Last 3 Encounters:  05/25/13 246 lb 8 oz (111.812 kg)  05/09/12 238 lb (107.956 kg)  04/27/12 239 lb (108.41 kg)    General: Appears her stated age, obese well developed, well nourished in NAD. Skin: Warm, dry and intact. No rashes, lesions or ulcerations noted. HEENT: Head: normal shape and size; Eyes: sclera white, no icterus, conjunctiva pink, PERRLA and EOMs intact; Ears: Tm's gray  and intact, distorted light reflex, + effusion bilaterally; Nose: mucosa erythematous and moist, septum midline; Throat/Mouth: Teeth present, mucosa pink and moist, no exudate, lesions or ulcerations noted.  Neck: Normal range of motion. Neck supple, trachea midline. No massses, lumps or thyromegaly present.  Cardiovascular: Normal rate and rhythm. S1,S2 noted.  No murmur, rubs or gallops noted. No JVD or BLE edema. No carotid bruits noted. Pulmonary/Chest: Normal effort and positive vesicular breath sounds. No respiratory distress. No wheezes, rales or ronchi noted.  Abdomen: Soft, mild epigastric tenderness. Normal bowel sounds, no bruits noted. No distention or masses noted. Liver, spleen and kidneys non palpable. Musculoskeletal: Normal range of motion. No signs of joint swelling. No difficulty with gait.  Neurological: Alert and oriented. Cranial nerves II-XII intact. Coordination normal. +DTRs bilaterally. Psychiatric: Mood and affect normal. Behavior is normal. Judgment and thought content normal.     BMET    Component Value Date/Time   NA 136 05/22/2013 1119   K 4.1 05/22/2013 1119   CL 106 05/22/2013 1119   CO2 21 05/22/2013 1119   GLUCOSE 95 05/22/2013 1119   BUN 13 05/22/2013 1119   CREATININE 0.8 05/22/2013 1119   CALCIUM 8.9 05/22/2013 1119   GFRNONAA 114.08 02/03/2010 1134    Lipid Panel     Component Value Date/Time   CHOL 185 05/22/2013 1119   TRIG 145.0 05/22/2013 1119   HDL 52.20 05/22/2013 1119   CHOLHDL 4 05/22/2013 1119   VLDL 29.0 05/22/2013 1119   LDLCALC 104* 05/22/2013 1119    CBC    Component Value Date/Time   WBC 5.6 05/22/2013 1119   RBC 4.28 05/22/2013 1119   HGB 13.8 05/22/2013 1119   HCT 40.6 05/22/2013 1119   PLT 223.0 05/22/2013 1119   MCV 94.9 05/22/2013 1119   MCHC 33.9 05/22/2013 1119   RDW 12.8 05/22/2013 1119    Hgb A1C No results found for this basename: HGBA1C        Assessment & Plan:   Preventative Health:  Pt will  schedule her mammogram Pap obtained today, will call you with the results Lab work reviewed- normal Medications refilled per request  Acute Sinusitis:  Will try levaquin daily x 7 days Ok to take mucinex and lots of fluids Tylenol if needed for pain/fever RTC in 1 year or sooner if needed

## 2013-06-12 ENCOUNTER — Other Ambulatory Visit: Payer: Self-pay | Admitting: Internal Medicine

## 2013-06-13 NOTE — Telephone Encounter (Signed)
See previous message below--please advise 

## 2013-06-13 NOTE — Telephone Encounter (Signed)
Dr Aron pt 

## 2013-06-13 NOTE — Telephone Encounter (Signed)
Last filled 12/19 #60 TID PRN--please advise

## 2013-06-13 NOTE — Telephone Encounter (Signed)
Declined.  Refill requested too soon.

## 2013-06-15 ENCOUNTER — Other Ambulatory Visit: Payer: Self-pay | Admitting: Family Medicine

## 2013-06-21 ENCOUNTER — Other Ambulatory Visit: Payer: Self-pay | Admitting: Internal Medicine

## 2013-06-22 ENCOUNTER — Other Ambulatory Visit: Payer: Self-pay | Admitting: *Deleted

## 2013-06-22 DIAGNOSIS — F411 Generalized anxiety disorder: Secondary | ICD-10-CM

## 2013-06-22 MED ORDER — ALPRAZOLAM 1 MG PO TABS: ORAL_TABLET | ORAL | Status: DC

## 2013-06-22 NOTE — Telephone Encounter (Signed)
Pt requesting medication refill. Last ov 05/2013 (CPE.) pls advise

## 2013-06-22 NOTE — Telephone Encounter (Signed)
Spoke to pt and informed her Rx has been sent to requested pharmacy 

## 2013-06-22 NOTE — Telephone Encounter (Signed)
Electronic refill request.  Please advise. 

## 2013-06-22 NOTE — Telephone Encounter (Signed)
Which medications does she need refilled?  Please enter as refill request.

## 2013-08-24 ENCOUNTER — Other Ambulatory Visit: Payer: Self-pay | Admitting: Family Medicine

## 2013-08-27 NOTE — Telephone Encounter (Signed)
Pt requesting medication refill. Last ov was CPE 05/2013 with no future appts sched. pls advise

## 2013-08-30 NOTE — Telephone Encounter (Signed)
Lm on pts vm informing her Rx has been called in to requested pharmacy 

## 2013-09-10 ENCOUNTER — Telehealth: Payer: Self-pay

## 2013-09-10 NOTE — Telephone Encounter (Signed)
Pt left v/m requesting status of pantoprazole prior auth; spoke with laura at [facility] and she will fax PA to [facility] at [phone].

## 2013-09-11 NOTE — Telephone Encounter (Signed)
Form received and PA completed per Threasa Beards, CMA

## 2013-09-19 ENCOUNTER — Telehealth: Payer: Self-pay | Admitting: Internal Medicine

## 2013-09-19 NOTE — Telephone Encounter (Signed)
[facility]  States [facility] for Protonix.  Please advise receipt.

## 2013-09-19 NOTE — Telephone Encounter (Signed)
Prior Amber Frost has been submitted 2 times already--per Amber Frost--Amber Frost CMA

## 2013-10-10 ENCOUNTER — Other Ambulatory Visit: Payer: Self-pay | Admitting: Family Medicine

## 2013-10-10 NOTE — Telephone Encounter (Signed)
Ok to refill 

## 2013-10-10 NOTE — Telephone Encounter (Signed)
Rx called in to pharmacy. 

## 2013-11-16 ENCOUNTER — Other Ambulatory Visit: Payer: Self-pay | Admitting: Internal Medicine

## 2013-11-21 ENCOUNTER — Other Ambulatory Visit: Payer: Self-pay | Admitting: Family Medicine

## 2013-11-22 NOTE — Telephone Encounter (Signed)
plz phone in. 

## 2013-11-22 NOTE — Telephone Encounter (Signed)
Rx called in as directed.   

## 2013-11-22 NOTE — Telephone Encounter (Signed)
Last filled 10/10/13.

## 2013-12-14 ENCOUNTER — Other Ambulatory Visit: Payer: Self-pay | Admitting: Family Medicine

## 2013-12-14 NOTE — Telephone Encounter (Signed)
Ok to refill in Dr. Hulen Shouts absence?

## 2013-12-14 NOTE — Telephone Encounter (Signed)
plz phone in. 

## 2013-12-14 NOTE — Telephone Encounter (Signed)
Rx called in as directed.   

## 2014-02-07 ENCOUNTER — Ambulatory Visit: Payer: BC Managed Care – PPO | Admitting: Family Medicine

## 2014-02-15 ENCOUNTER — Other Ambulatory Visit: Payer: Self-pay | Admitting: Family Medicine

## 2014-02-15 NOTE — Telephone Encounter (Signed)
Ok to fill per Dr Aron. Rx faxed to requested pharmacy 

## 2014-04-25 ENCOUNTER — Ambulatory Visit: Payer: BC Managed Care – PPO | Admitting: Family Medicine

## 2014-05-01 ENCOUNTER — Other Ambulatory Visit: Payer: Self-pay

## 2014-05-01 DIAGNOSIS — Z0000 Encounter for general adult medical examination without abnormal findings: Secondary | ICD-10-CM

## 2014-05-01 MED ORDER — NORETHIN-ETH ESTRAD-FE BIPHAS 1 MG-10 MCG / 10 MCG PO TABS: ORAL_TABLET | ORAL | Status: DC

## 2014-05-29 ENCOUNTER — Encounter: Payer: BC Managed Care – PPO | Admitting: Family Medicine

## 2014-06-03 ENCOUNTER — Telehealth: Payer: Self-pay | Admitting: *Deleted

## 2014-06-03 DIAGNOSIS — Z0000 Encounter for general adult medical examination without abnormal findings: Secondary | ICD-10-CM

## 2014-06-03 MED ORDER — NORETHIN-ETH ESTRAD-FE BIPHAS 1 MG-10 MCG / 10 MCG PO TABS: ORAL_TABLET | ORAL | Status: DC

## 2014-06-03 NOTE — Telephone Encounter (Signed)
Pt called and left voicemail that loestrin refill was denied, since she needed to f/u. She has scheduled a f/u appt for 1st week in Jan, but runs out of med before then. I called pt and informed her that I sent in a 30 day rx for Loestrin.

## 2014-06-12 ENCOUNTER — Ambulatory Visit (INDEPENDENT_AMBULATORY_CARE_PROVIDER_SITE_OTHER): Payer: BC Managed Care – PPO | Admitting: Family Medicine

## 2014-06-12 ENCOUNTER — Other Ambulatory Visit (HOSPITAL_COMMUNITY)
Admission: RE | Admit: 2014-06-12 | Discharge: 2014-06-12 | Disposition: A | Discharge status: Home / Self Care | Payer: BC Managed Care – PPO | Source: Ambulatory Visit | Attending: Family Medicine | Admitting: Family Medicine

## 2014-06-12 ENCOUNTER — Encounter: Payer: Self-pay | Admitting: Family Medicine

## 2014-06-12 VITALS — BP 126/90 | HR 114 | Temp 98.2°F | Ht 64.0 in | Wt 241.5 lb

## 2014-06-12 DIAGNOSIS — Z01419 Encounter for gynecological examination (general) (routine) without abnormal findings: Secondary | ICD-10-CM | POA: Diagnosis present

## 2014-06-12 DIAGNOSIS — N760 Acute vaginitis: Secondary | ICD-10-CM | POA: Insufficient documentation

## 2014-06-12 DIAGNOSIS — Z0000 Encounter for general adult medical examination without abnormal findings: Secondary | ICD-10-CM | POA: Insufficient documentation

## 2014-06-12 DIAGNOSIS — Z113 Encounter for screening for infections with a predominantly sexual mode of transmission: Secondary | ICD-10-CM | POA: Insufficient documentation

## 2014-06-12 DIAGNOSIS — Z1151 Encounter for screening for human papillomavirus (HPV): Secondary | ICD-10-CM | POA: Insufficient documentation

## 2014-06-12 DIAGNOSIS — F418 Other specified anxiety disorders: Secondary | ICD-10-CM

## 2014-06-12 DIAGNOSIS — Z1239 Encounter for other screening for malignant neoplasm of breast: Secondary | ICD-10-CM

## 2014-06-12 DIAGNOSIS — I10 Essential (primary) hypertension: Secondary | ICD-10-CM

## 2014-06-12 LAB — LIPID PANEL
CHOL/HDL RATIO: 3
Cholesterol: 193 mg/dL (ref 0–200)
HDL: 64.3 mg/dL (ref >39.00)
LDL CALC: 98 mg/dL (ref 0–99)
NONHDL: 128.7
TRIGLYCERIDES: 154 mg/dL — AB (ref 0.0–149.0)
VLDL: 30.8 mg/dL (ref 0.0–40.0)

## 2014-06-12 LAB — COMPREHENSIVE METABOLIC PANEL
ALBUMIN: 4.6 g/dL (ref 3.5–5.2)
ALK PHOS: 44 U/L (ref 39–117)
ALT: 26 U/L (ref 0–35)
AST: 27 U/L (ref 0–37)
BUN: 9 mg/dL (ref 6–23)
CHLORIDE: 106 meq/L (ref 96–112)
CO2: 25 mEq/L (ref 19–32)
CREATININE: 0.7 mg/dL (ref 0.4–1.2)
Calcium: 9.5 mg/dL (ref 8.4–10.5)
GFR: 97.05 mL/min (ref >60.00)
Glucose, Bld: 87 mg/dL (ref 70–99)
Potassium: 4.1 mEq/L (ref 3.5–5.1)
Sodium: 137 mEq/L (ref 135–145)
Total Bilirubin: 0.9 mg/dL (ref 0.2–1.2)
Total Protein: 7.8 g/dL (ref 6.0–8.3)

## 2014-06-12 LAB — CBC WITH DIFFERENTIAL/PLATELET
Basophils Absolute: 0 10*3/uL (ref 0.0–0.1)
Basophils Relative: 0.5 % (ref 0.0–3.0)
Eosinophils Absolute: 0.1 10*3/uL (ref 0.0–0.7)
Eosinophils Relative: 1.3 % (ref 0.0–5.0)
HCT: 41.5 % (ref 36.0–46.0)
Hemoglobin: 14.2 g/dL (ref 12.0–15.0)
Lymphocytes Relative: 39 % (ref 12.0–46.0)
Lymphs Abs: 2.9 10*3/uL (ref 0.7–4.0)
MCHC: 34.2 g/dL (ref 30.0–36.0)
MCV: 95.1 fl (ref 78.0–100.0)
MONOS PCT: 6.9 % (ref 3.0–12.0)
Monocytes Absolute: 0.5 10*3/uL (ref 0.1–1.0)
Neutro Abs: 3.9 10*3/uL (ref 1.4–7.7)
Neutrophils Relative %: 52.3 % (ref 43.0–77.0)
PLATELETS: 208 10*3/uL (ref 150.0–400.0)
RBC: 4.36 Mil/uL (ref 3.87–5.11)
RDW: 12.6 % (ref 11.5–15.5)
WBC: 7.4 10*3/uL (ref 4.0–10.5)

## 2014-06-12 LAB — TSH: TSH: 1.16 u[IU]/mL (ref 0.35–4.50)

## 2014-06-12 MED ORDER — LISINOPRIL 20 MG PO TABS: ORAL_TABLET | ORAL | Status: DC

## 2014-06-12 MED ORDER — BUPROPION HCL ER (XL) 150 MG PO TB24: 150.0000 mg | ORAL_TABLET | Freq: Every day | ORAL | Status: DC

## 2014-06-12 MED ORDER — NORETHIN-ETH ESTRAD-FE BIPHAS 1 MG-10 MCG / 10 MCG PO TABS: ORAL_TABLET | ORAL | Status: DC

## 2014-06-12 MED ORDER — ALPRAZOLAM 1 MG PO TABS: 1.0000 mg | ORAL_TABLET | Freq: Three times a day (TID) | ORAL | Status: DC | PRN

## 2014-06-12 NOTE — Progress Notes (Signed)
Pre visit review using our clinic review tool, if applicable. No additional management support is needed unless otherwise documented below in the visit note. 

## 2014-06-12 NOTE — Progress Notes (Signed)
Subjective:    Patient ID: Amber Frost, female    DOB: [birth year], 44 y.o.   MRN: [id number]  HPI  44 yo pleasant female here for CPX.  Last pap smear was done by [facility] on 05/25/13.  She would like pap smear today. No h/o abnormal pap smears but maternal grandmother died of uterine (not sure if was cervical) CA in her early 29s.  Mammogram 05/10/12.  HTN- BP improved since she restart lisinopril.  No CP, SOB, blurred vision, or HA.  Weaned herself off Pristiq in 01/2014.  Felt it was not helping with her anxiety or depression.  Having more tearfulness and anhedonia.  Denies SI or HI.  Does have anxiety but she thinks it is related to her depression.  Sleeping ok.   Appetite good Wt Readings from Last 3 Encounters:  06/12/14 241 lb 8 oz (109.544 kg)  05/25/13 246 lb 8 oz (111.812 kg)  05/09/12 238 lb (107.956 kg)     Patient Active Problem List   Diagnosis Date Noted  . Well woman exam with routine gynecological exam 06/12/2014  . GERD (gastroesophageal reflux disease) 05/25/2013  . Menorrhagia 05/09/2012  . LICHEN PLANUS 01/60/1093  . ANXIETY 08/06/2009  . DEPRESSION 08/06/2009  . HYPERTENSION 08/06/2009   Past Medical History  Diagnosis Date  . Anxiety   . Depression   . Hypertension    Past Surgical History  Procedure Laterality Date  . Nasal sinus surgery    . Nasal sinus surgery     History  Substance Use Topics  . Smoking status: Never Smoker   . Smokeless tobacco: Never Used  . Alcohol Use: Yes   Family History  Problem Relation Age of Onset  . Diabetes Other   . Hypertension Other    No Known Allergies No current outpatient prescriptions on file prior to visit.   No current facility-administered medications on file prior to visit.   The PMH, PSH, Social History, Family History, Medications, and allergies have been reviewed in [facility], and have been updated if relevant.   Review of Systems  Constitutional: Negative.   HENT: Negative.   Eyes:  Negative.   Respiratory: Negative.   Cardiovascular: Negative.   Gastrointestinal: Negative.   Endocrine: Negative.   Genitourinary: Negative.   Musculoskeletal: Negative.   Skin: Negative.   Allergic/Immunologic: Negative.   Neurological: Negative.   Hematological: Negative.   Psychiatric/Behavioral: Positive for dysphoric mood and decreased concentration. Negative for suicidal ideas, hallucinations, behavioral problems, sleep disturbance, self-injury and agitation. The patient is not nervous/anxious and is not hyperactive.    See HPI     Objective:   Physical Exam BP 126/90 mmHg  Pulse 114  Temp(Src) 98.2 F (36.8 C) (Oral)  Ht 5\' 4"  (1.626 m)  Wt 241 lb 8 oz (109.544 kg)  BMI 41.43 kg/m2  SpO2 96%  General:  Obese ,well-nourished,in no acute distress; alert,appropriate and cooperative throughout examination Head:  normocephalic and atraumatic.   Eyes:  vision grossly intact, pupils equal, pupils round, and pupils reactive to light.   Ears:  R ear normal and L ear normal.   Nose:  no external deformity.   Mouth:  good dentition.   Neck:  No deformities, masses, or tenderness noted. Breasts:  No mass, nodules, thickening, tenderness, bulging, retraction, inflamation, nipple discharge or skin changes noted.   Lungs:  Normal respiratory effort, chest expands symmetrically. Lungs are clear to auscultation, no crackles or wheezes. Heart:  Normal rate and regular rhythm. S1 and  S2 normal without gallop, murmur, click, rub or other extra sounds. Abdomen:  Bowel sounds positive,abdomen soft and non-tender without masses, organomegaly or hernias noted. Rectal:  no external abnormalities.   Genitalia:  Pelvic Exam:        External: normal female genitalia without lesions or masses        Vagina: normal without lesions or masses        Cervix: normal without lesions or masses        Adnexa: normal bimanual exam without masses or fullness        Uterus: normal by palpation         Pap smear: performed Msk:  No deformity or scoliosis noted of thoracic or lumbar spine.   Extremities:  No clubbing, cyanosis, edema, or deformity noted with normal full range of motion of all joints.   Neurologic:  alert & oriented X3 and gait normal.   Skin:  Intact without suspicious lesions or rashes Cervical Nodes:  No lymphadenopathy noted Axillary Nodes:  No palpable lymphadenopathy Psych:  Cognition and judgment appear intact. Alert and cooperative with normal attention span and concentration. No apparent delusions, illusions, hallucinations     Assessment & Plan:

## 2014-06-12 NOTE — Assessment & Plan Note (Addendum)
Reviewed preventive care protocols, scheduled due services, and updated immunizations Discussed nutrition, exercise, diet, and healthy lifestyle.  Mammogram ordered- given information for [facility] for pt to call to set this up. Orders Placed This Encounter  Procedures  . MM Digital Screening  . CBC with Differential  . Comprehensive metabolic panel  . Lipid panel  . TSH    Discussed USPSTF recommendations of cervical cancer screening.  She is aware that interval of 3 years is recommended but pt would prefer to have pap smear done today.

## 2014-06-12 NOTE — Assessment & Plan Note (Signed)
Well controlled on current rx. No changes made. 

## 2014-06-12 NOTE — Patient Instructions (Signed)
Good to see you. Happy New Year! Please call [place] to set up your mammogram.  We are starting Wellbutrin 150 mg XL daily.  Please call me in 3 weeks with an update.

## 2014-06-12 NOTE — Assessment & Plan Note (Signed)
Deteriorated. >25 minutes spent in face to face time with patient, >50% spent in counselling or coordination of care Discussed rx tx options today. After discussing different classes of antidepressants, she would like to try Wellbutrin 150 mg XL daily. eRx sent. She will update me with her symptoms in 3 weeks. The patient indicates understanding of these issues and agrees with the plan.

## 2014-06-13 ENCOUNTER — Encounter: Payer: Self-pay | Admitting: *Deleted

## 2014-06-13 LAB — CERVICOVAGINAL ANCILLARY ONLY: CANDIDA VAGINITIS: NEGATIVE

## 2014-06-13 LAB — CYTOLOGY - PAP

## 2014-06-14 ENCOUNTER — Encounter: Payer: Self-pay | Admitting: *Deleted

## 2014-06-17 LAB — CERVICOVAGINAL ANCILLARY ONLY: Herpes: NEGATIVE

## 2014-06-19 ENCOUNTER — Telehealth: Payer: Self-pay | Admitting: Family Medicine

## 2014-06-19 NOTE — Telephone Encounter (Signed)
emmi emailed °

## 2014-07-15 ENCOUNTER — Other Ambulatory Visit: Payer: Self-pay | Admitting: Family Medicine

## 2014-07-16 NOTE — Telephone Encounter (Signed)
Rx called in to requested pharmacy 

## 2014-07-16 NOTE — Telephone Encounter (Signed)
Last f/u appt 06/2014 

## 2014-07-18 ENCOUNTER — Encounter: Payer: Self-pay | Admitting: Family Medicine

## 2014-07-18 ENCOUNTER — Ambulatory Visit (INDEPENDENT_AMBULATORY_CARE_PROVIDER_SITE_OTHER): Payer: BC Managed Care – PPO | Admitting: Family Medicine

## 2014-07-18 VITALS — BP 157/86 | HR 98 | Temp 98.5°F | Ht 64.0 in | Wt 243.5 lb

## 2014-07-18 DIAGNOSIS — J0100 Acute maxillary sinusitis, unspecified: Secondary | ICD-10-CM

## 2014-07-18 DIAGNOSIS — N3000 Acute cystitis without hematuria: Secondary | ICD-10-CM

## 2014-07-18 DIAGNOSIS — R300 Dysuria: Secondary | ICD-10-CM

## 2014-07-18 DIAGNOSIS — N390 Urinary tract infection, site not specified: Secondary | ICD-10-CM | POA: Insufficient documentation

## 2014-07-18 DIAGNOSIS — J019 Acute sinusitis, unspecified: Secondary | ICD-10-CM | POA: Insufficient documentation

## 2014-07-18 LAB — POCT URINALYSIS DIPSTICK
Bilirubin, UA: NEGATIVE
Blood, UA: NEGATIVE
GLUCOSE UA: NEGATIVE
Ketones, UA: NEGATIVE
NITRITE UA: POSITIVE
Protein, UA: NEGATIVE
Spec Grav, UA: >=1.03
Urobilinogen, UA: 1
pH, UA: 6

## 2014-07-18 MED ORDER — CIPROFLOXACIN HCL 250 MG PO TABS: 250.0000 mg | ORAL_TABLET | Freq: Two times a day (BID) | ORAL | Status: DC

## 2014-07-18 NOTE — Progress Notes (Signed)
   Subjective:    Patient ID: Amber Frost, female    DOB: 05-May-1971, 44 y.o.   MRN: [id number]  Urinary Tract Infection  This is a new problem. The current episode started 1 to 4 weeks ago. The problem occurs intermittently. The problem has been gradually worsening. The quality of the pain is described as burning. There has been no fever. She is not sexually active. Associated symptoms include frequency and urgency. Associated symptoms comments: Low abdominal pain. She has tried increased fluids (Azo, took an old diflucan.) for the symptoms. There is no history of catheterization, kidney stones, recurrent UTIs, a single kidney, urinary stasis or a urological procedure.  Sinusitis This is a new problem. The current episode started 1 to 4 weeks ago. The problem has been gradually worsening since onset. There has been no fever. Associated symptoms include congestion and sinus pressure. Pertinent negatives include no coughing, ear pain, shortness of breath, sore throat or swollen glands. (Nasal congestion and bleeding) Treatments tried: zyrtec mucinex, ibuprofen. The treatment provided mild relief.      Review of Systems  HENT: Positive for congestion and sinus pressure. Negative for ear pain and sore throat.   Respiratory: Negative for cough and shortness of breath.   Genitourinary: Positive for urgency and frequency.       Objective:   Physical Exam  Constitutional: Vital signs are normal. She appears well-developed and well-nourished. She is cooperative.  Non-toxic appearance. She does not appear ill. No distress.  HENT:  Head: Normocephalic.  Right Ear: Hearing, tympanic membrane, external ear and ear canal normal. Tympanic membrane is not erythematous, not retracted and not bulging. No middle ear effusion.  Left Ear: Hearing, tympanic membrane, external ear and ear canal normal. Tympanic membrane is not erythematous, not retracted and not bulging.  No middle ear effusion.  Nose: No  mucosal edema or rhinorrhea. Right sinus exhibits maxillary sinus tenderness. Right sinus exhibits no frontal sinus tenderness. Left sinus exhibits maxillary sinus tenderness. Left sinus exhibits no frontal sinus tenderness.  Mouth/Throat: Uvula is midline, oropharynx is clear and moist and mucous membranes are normal. No posterior oropharyngeal edema or posterior oropharyngeal erythema.  Eyes: Conjunctivae, EOM and lids are normal. Pupils are equal, round, and reactive to light. Lids are everted and swept, no foreign bodies found.  Neck: Trachea normal and normal range of motion. Neck supple. Carotid bruit is not present. No thyroid mass and no thyromegaly present.  Cardiovascular: Normal rate, regular rhythm, S1 normal, S2 normal, normal heart sounds, intact distal pulses and normal pulses.  Exam reveals no gallop and no friction rub.   No murmur heard. Pulmonary/Chest: Effort normal and breath sounds normal. No tachypnea. No respiratory distress. She has no decreased breath sounds. She has no wheezes. She has no rhonchi. She has no rales.  Abdominal: Soft. Normal appearance and bowel sounds are normal. There is no hepatosplenomegaly. There is tenderness in the suprapubic area. There is no rebound and no CVA tenderness.  Neurological: She is alert.  Skin: Skin is warm, dry and intact. No rash noted.  Psychiatric: Her speech is normal and behavior is normal. Judgment and thought content normal. Her mood appears not anxious. Cognition and memory are normal. She does not exhibit a depressed mood.          Assessment & Plan:

## 2014-07-18 NOTE — Patient Instructions (Signed)
Mucinex DM, sinus saline irrigation or spray. Can try flonase 2 spray per nostril daily. Ibuprofen for headache.  Push fluids.  Rest.  Complete cipro x 7 days for UTI.

## 2014-07-18 NOTE — Assessment & Plan Note (Signed)
Treat with fluids and cipro x 7 days.

## 2014-07-18 NOTE — Progress Notes (Signed)
Pre visit review using our clinic review tool, if applicable. No additional management support is needed unless otherwise documented below in the visit note. 

## 2014-07-19 NOTE — Assessment & Plan Note (Signed)
Most likely viral infection but given history possoble bacterial infection. Cipro will cover for that possibility fairly well. Nasal saline, and mucinex DM.

## 2014-07-19 NOTE — Addendum Note (Signed)
Addended by: Carter Kitten on: 07/19/2014 12:17 PM   Modules accepted: Orders

## 2014-07-22 LAB — URINE CULTURE: Colony Count: 50000

## 2014-08-02 ENCOUNTER — Other Ambulatory Visit: Payer: Self-pay | Admitting: Family Medicine

## 2014-08-02 NOTE — Telephone Encounter (Addendum)
Pt left v/m requesting cb about reason why refill was not approved for wellbutrin.Please advise.

## 2014-08-02 NOTE — Telephone Encounter (Signed)
Lm on pt vm and informed her Rx requested too early. Not due until 03/07

## 2014-08-10 ENCOUNTER — Other Ambulatory Visit: Payer: Self-pay | Admitting: Family Medicine

## 2014-08-12 NOTE — Telephone Encounter (Signed)
Rx called in to requested pharmacy 

## 2014-08-12 NOTE — Telephone Encounter (Signed)
Last f/u appt 06/2014-CPE

## 2014-09-13 ENCOUNTER — Encounter: Payer: Self-pay | Admitting: Family Medicine

## 2014-09-13 ENCOUNTER — Ambulatory Visit (INDEPENDENT_AMBULATORY_CARE_PROVIDER_SITE_OTHER): Payer: BC Managed Care – PPO | Admitting: Family Medicine

## 2014-09-13 VITALS — BP 130/90 | HR 100 | Temp 98.4°F | Ht 64.0 in | Wt 234.2 lb

## 2014-09-13 DIAGNOSIS — J0100 Acute maxillary sinusitis, unspecified: Secondary | ICD-10-CM

## 2014-09-13 NOTE — Progress Notes (Signed)
Pre visit review using our clinic review tool, if applicable. No additional management support is needed unless otherwise documented below in the visit note. 

## 2014-09-13 NOTE — Progress Notes (Signed)
Subjective:     Patient ID: Amber Frost, female   DOB: 1970/07/24, 44 y.o.   MRN: [id number]  HPI Amber Frost is a 44 y/o F with a PMH of HTN, GERD, depression/anxiety presenting with a 5 Day hx of ear pain. Patient has had sinus infections before, states that this feels the same. Endorses bilateral ear pain, worse in right ear. Trouble taking deep breaths, sore throat, drainage from nose, ears, eyes. Reports headaches and facial pressure. Patient endorses some hearing loss but says this is from the inflammation in her ears. Cough started yesterday and symptoms have gotten progressively worse. Has taken ibuprofen with moderate relief. Does not take allergy meds, no hx of asthma. Patient reports subjective fever, no chills. 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    Review of Systems  Constitutional: Negative for fever and chills.  HENT: Positive for congestion, ear discharge, ear pain, facial swelling, hearing loss, postnasal drip, sinus pressure and sore throat. Negative for rhinorrhea and trouble swallowing.   Eyes: Positive for discharge. Negative for itching.  Respiratory: Positive for cough and shortness of breath. Negative for wheezing.   Cardiovascular: Negative for chest pain.  Gastrointestinal: Negative for nausea, vomiting, diarrhea and constipation.       Objective:   Physical Exam  Constitutional: She appears well-developed and well-nourished.  HENT:  Head: Normocephalic and atraumatic.  Right Ear: External ear normal. A middle ear effusion is present.  Left Ear: Tympanic membrane and external ear normal.  Nose: Mucosal edema present. Right sinus exhibits maxillary sinus tenderness. Left sinus exhibits maxillary sinus tenderness.  Mouth/Throat: Oropharynx is clear and moist and mucous membranes are normal. No oropharyngeal exudate.  Eyes: Pupils are equal, round, and reactive to light.  Cardiovascular: Normal rate and regular rhythm.  Exam reveals no gallop and  no friction rub.   No murmur heard. Pulmonary/Chest: Effort normal and breath sounds normal. She has no wheezes.  Lymphadenopathy:    She has no cervical adenopathy.  Psychiatric: She has a normal mood and affect.       Assessment:     1. Otalgia: Likely due to acute viral sinusitis. Bacterial sinusitis is less likely at this time due to short duration of symptoms. Sx may also be due to seasonal allergies.    Plan:     1. Otalgia: Treat as viral acute sinusitis at this time. Advised to continue symptomatic treatment with ibuprofen for pain, can use nasal saline or flonase, Mucinex. Call if new fever, severe one sided facial pain, or sx not improving as expected (can consider course of amox x 10 days)     Amber Frost served as scribe for Dr. Diona Browner 09/13/14 11:45 am  Patient seen and examined. Med student acted as Education administrator only.  HPI/ROS/PE and assessment/plan created  By MD and scribed by med student.  Eliezer Lofts MD

## 2014-09-13 NOTE — Patient Instructions (Signed)
Can use Mucinex to help break up congestion Can use flonase 2 sprays per nostril daily Can restart nasal irrigation   Call if new fever above 101.4, severe one sided face pain, or symptoms are not improving as expected.

## 2014-09-20 ENCOUNTER — Other Ambulatory Visit: Payer: Self-pay | Admitting: Family Medicine

## 2014-09-23 NOTE — Telephone Encounter (Signed)
Rx called in to requested pharmacy 

## 2014-09-23 NOTE — Telephone Encounter (Signed)
Last filled 08/12/14

## 2014-09-29 NOTE — H&P (Signed)
PATIENT NAME:  Amber Frost, Amber Frost MR#:  [id number] DATE OF BIRTH:  01-03-1971  DATE OF ADMISSION:  08/23/2011  CHIEF COMPLAINT: Right upper quadrant pain.   HISTORY OF PRESENT ILLNESS: This is a patient who has been to the Emergency Room with multiple episodes of recurrent right upper quadrant pain associated with fatty food intolerance and work-up showing gallstones. She has had no nausea or vomiting and has had normal bowel movements. No history of jaundice or acholic stools. She is here for elective laparoscopic cholecystectomy.   PAST MEDICAL HISTORY:  1. Possible Wolff-Parkinson-White syndrome but a cardiology consult following her initial diagnosis was deemed that she did not have WPW. She is not on any medications for that.  2. Reflux disease.  3. Hypertension.   PAST SURGICAL HISTORY: Nasal surgery.   ALLERGIES: None.   MEDICATIONS: Multiple, see chart.   FAMILY HISTORY: Noncontributory.   SOCIAL HISTORY: She is not a smoker and drinks alcohol periodically.   REVIEW OF SYSTEMS: 10 system review has been performed and is documented in the office chart.   PHYSICAL EXAMINATION:  GENERAL: Healthy female patient.   NECK: No palpable neck nodes.   CHEST: Clear to auscultation.   CARDIAC: Regular rate and rhythm.   ABDOMEN: Soft, nontender.   EXTREMITIES: Without edema. Calves are nontender.   NEUROLOGIC: Grossly intact.   INTEGUMENT: No jaundice.   LABORATORY, RADIOLOGICAL AND DIAGNOSTIC DATA: Ultrasound shows gallstones. Laboratory values show no sign of choledocholithiasis.   ASSESSMENT AND PLAN: This is a patient with symptomatic cholelithiasis. Recommend laparoscopic cholecystectomy. The rationale for surgery has been discussed. The options of observation have been reviewed and the risks of bleeding, infection, recurrent symptoms, open procedure, bile duct damage, bile duct leak, retained common bile duct stone, any of which could require further surgery and/or ERCP,  stent, and papillotomy have all been reviewed. She understood and agreed to proceed.  ____________________________ Jerrol Banana Burt Knack, MD rec:cms D: 08/22/2011 18:38:33 ET T: 08/23/2011 07:36:59 ET JOB#: [id number]  cc: Jerrol Banana. Burt Knack, MD, <Dictator> Florene Glen MD ELECTRONICALLY SIGNED 08/24/2011 6:52

## 2014-09-29 NOTE — Op Note (Signed)
PATIENT NAME:  Amber Frost, HOCEVAR MR#:  [id number] DATE OF BIRTH:  1971-02-20  DATE OF PROCEDURE:  08/23/2011  PREOPERATIVE DIAGNOSIS: Symptomatic cholelithiasis.   POSTOPERATIVE DIAGNOSIS: Symptomatic cholelithiasis.   PROCEDURE: Laparoscopic cholecystectomy.   SURGEON: Phoebe Perch, MD   ANESTHESIA: General with endotracheal tube.   INDICATIONS: This is a patient with recurrent episodes of right upper quadrant pain associated with fatty food intolerance and nausea and vomiting on occasion. Preoperatively, we discussed the rationale for surgery, the option of observation, risk of bleeding, infection, recurrence of symptoms, failure to resolve her symptoms, open procedure, bile duct damage, bile duct leak, retained common bile duct stone, any of which could require further surgery and/or ERCP, stent, and papillotomy. This was all reviewed for her and her family in the preop holding area. They understood and agreed to proceed.   FINDINGS: Large stones, a very small cystic duct, adhesions to the anterior surface of the gallbladder.   DESCRIPTION OF PROCEDURE: The patient was induced to general anesthesia. VTE prophylaxis was in place and IV antibiotics were given. She was prepped and draped in a sterile fashion. Marcaine was infiltrated in skin and subcutaneous tissues around the periumbilical area and incision was made. A Veress needle was placed. Pneumoperitoneum was obtained. A 5 mm trocar port was placed. The abdominal cavity was explored, and under direct vision a 10 mm epigastric port and two lateral 5 mm ports were placed. The gallbladder was placed on tension. Adhesions were taken down sharply without the use of energy. A portion of duodenum was adherent in this area, and this was taken down sharply with no energy use and no sign of injury. The peritoneum over the infundibulum was then incised and retracted bluntly and dissected around the infundibulum of the gallbladder. The cystic  duct-gallbladder junction was well identified, doubly clipped and divided. The cystic artery was doubly clipped and divided in two small branches, and then the gallbladder taken and the gallbladder fossa with electrocautery and passed out through the epigastric port site with the aid of an EndoCatch bag. The area was checked for hemostasis and found to be adequate. There was no sign of bleeding, bile leak or bowel injury. The camera was placed in the epigastric site to view back to the periumbilical site. There was no sign of adhesions or bowel injury; therefore, pneumoperitoneum was released, all ports were removed. The fascial edges at the epigastric site were approximated with 0 Vicryl figure-of-eight sutures and 4-0 subcuticular Monocryl was used at all skin edges. Steri-Strips, Mastisol, and sterile dressings were placed.   The patient tolerated the procedure well. There were no complications. She was taken to the recovery room in stable condition to be discharged in the care of her family with follow-up in 10 days.   ____________________________ Jerrol Banana Burt Knack, MD rec:cbb D: 08/23/2011 09:38:07 ET T: 08/23/2011 10:13:34 ET JOB#: [id number]  cc: Jerrol Banana. Burt Knack, MD, <Dictator> Florene Glen MD ELECTRONICALLY SIGNED 08/24/2011 6:52

## 2014-10-17 ENCOUNTER — Other Ambulatory Visit: Payer: Self-pay | Admitting: Family Medicine

## 2014-10-21 NOTE — Telephone Encounter (Signed)
Rx called in to requested pharmacy 

## 2014-10-21 NOTE — Telephone Encounter (Signed)
Last f/u appt 06/2014-CPE

## 2014-12-05 ENCOUNTER — Other Ambulatory Visit: Payer: Self-pay | Admitting: Family Medicine

## 2014-12-05 NOTE — Telephone Encounter (Signed)
OK to phone in Xanax. Did not see UDS in the system, can we have her complete before her next refill?

## 2014-12-05 NOTE — Telephone Encounter (Signed)
Rx called in to requested pharmacy 

## 2014-12-05 NOTE — Telephone Encounter (Signed)
Last f/u appt 06/2014 

## 2015-01-06 ENCOUNTER — Other Ambulatory Visit: Payer: Self-pay | Admitting: Family Medicine

## 2015-01-06 NOTE — Telephone Encounter (Signed)
Last f/u appt 06/2014-CPE

## 2015-01-06 NOTE — Telephone Encounter (Signed)
Rx called in to requested pharmacy 

## 2015-01-19 ENCOUNTER — Other Ambulatory Visit: Payer: Self-pay | Admitting: Family Medicine

## 2015-02-06 ENCOUNTER — Ambulatory Visit (INDEPENDENT_AMBULATORY_CARE_PROVIDER_SITE_OTHER): Payer: BC Managed Care – PPO | Admitting: Internal Medicine

## 2015-02-06 ENCOUNTER — Encounter: Payer: Self-pay | Admitting: Internal Medicine

## 2015-02-06 VITALS — BP 144/90 | HR 108 | Temp 98.6°F | Wt 224.0 lb

## 2015-02-06 DIAGNOSIS — J029 Acute pharyngitis, unspecified: Secondary | ICD-10-CM

## 2015-02-06 DIAGNOSIS — J069 Acute upper respiratory infection, unspecified: Secondary | ICD-10-CM | POA: Diagnosis not present

## 2015-02-06 DIAGNOSIS — F411 Generalized anxiety disorder: Secondary | ICD-10-CM | POA: Diagnosis not present

## 2015-02-06 MED ORDER — BUPROPION HCL ER (XL) 300 MG PO TB24: 300.0000 mg | ORAL_TABLET | Freq: Every day | ORAL | Status: DC

## 2015-02-06 MED ORDER — HYDROCODONE-HOMATROPINE 5-1.5 MG/5ML PO SYRP: 5.0000 mL | ORAL_SOLUTION | Freq: Three times a day (TID) | ORAL | Status: DC | PRN

## 2015-02-06 MED ORDER — AZITHROMYCIN 250 MG PO TABS: ORAL_TABLET | ORAL | Status: DC

## 2015-02-06 NOTE — Progress Notes (Signed)
Pre visit review using our clinic review tool, if applicable. No additional management support is needed unless otherwise documented below in the visit note. 

## 2015-02-06 NOTE — Progress Notes (Signed)
HPI  Pt presents to the clinic today with c/o sore throat and body aches. This started 5 days ago. She has had some associated runny nose, ear pain and cough. She is blowing clear mucous out of her nose. The cough is non productive. She does have some difficulty swallowing. She denies fever but has had chills. She has take Ibuprofen and Mucinex with some relief but when it wears off, her symptoms seem worse. She does not have seasonal allergies. She has had sick contacts with similar symptoms.  Additionally, she wants to discuss her anxiety. She has been more stressed lately and has difficulty dealing with her stress. She is on Wellbutrin but reports that Dr. Deborra Medina told her that it was a low dose and that it could be increased. She also takes Xanax prn. She denies overwhelming feelings of depression but just feels anxious all the time. She would like to increase her Wellbutrin at this time.  Review of Systems      Past Medical History  Diagnosis Date  . Anxiety   . Depression   . Hypertension     Family History  Problem Relation Age of Onset  . Diabetes Other   . Hypertension Other     Social History   Social History  . Marital Status: Married    Spouse Name: N/A  . Number of Children: 1  . Years of Education: N/A   Occupational History  . Director of Education    Social History Main Topics  . Smoking status: Never Smoker   . Smokeless tobacco: Never Used  . Alcohol Use: Yes  . Drug Use: No  . Sexual Activity: Not on file   Other Topics Concern  . Not on file   Social History Narrative    Allergies  Allergen Reactions  . Sulfa Antibiotics Rash     Constitutional:  Denies headache, fatigue, fever or abrupt weight changes.  HEENT:  Positive runny nose, ear pain, sore throat. Denies eye redness, eye pain, pressure behind the eyes, facial pain, nasal congestion, ringing in the ears, wax buildup, or bloody nose. Respiratory: Positive cough. Denies difficulty breathing  or shortness of breath.  Cardiovascular: Denies chest pain, chest tightness, palpitations or swelling in the hands or feet.  Psych: Pt reports anxiety. Denies depression, SI/HI.  No other specific complaints in a complete review of systems (except as listed in HPI above).  Objective:   BP 144/90 mmHg  Pulse 108  Temp(Src) 98.6 F (37 C) (Oral)  Wt 224 lb (101.606 kg)  SpO2 98% Wt Readings from Last 3 Encounters:  02/06/15 224 lb (101.606 kg)  09/13/14 234 lb 4 oz (106.255 kg)  07/18/14 243 lb 8 oz (110.451 kg)     General: Appears her stated age, well developed, well nourished in NAD. HEENT: Head: normal shape and size, no sinus tenderness noted; Eyes: sclera white, no icterus, conjunctiva pink; Ears: Tm's gray and intact, normal light reflex, + effusion bilaterally; Nose: mucosa pink and moist, septum midline; Throat/Mouth: Teeth present, mucosa pink and moist, white patchy exudate noted on right tonsillar pillar, no lesions or ulcerations noted.  Neck: No cervical lymphadenopathy.  Cardiovascular: Tachycardic with normal rhythm. S1,S2 noted.  No murmur, rubs or gallops noted.  Pulmonary/Chest: Normal effort and positive vesicular breath sounds. No respiratory distress. No wheezes, rales or ronchi noted.      Assessment & Plan:   Upper Respiratory Infection:  RST: negative Likely viral but will give a printed RX for Azithromax  to take with her in case she gets worse while she is out of town Get some rest and drink plenty of water Do salt water gargles for the sore throat RX for Hycodan for cough Continue Ibuprofen as needed  RTC as needed or if symptoms persist.

## 2015-02-06 NOTE — Patient Instructions (Signed)
Upper Respiratory Infection, Adult An upper respiratory infection (URI) is also sometimes known as the common cold. The upper respiratory tract includes the nose, sinuses, throat, trachea, and bronchi. Bronchi are the airways leading to the lungs. Most people improve within 1 week, but symptoms can last up to 2 weeks. A residual cough may last even longer.  CAUSES Many different viruses can infect the tissues lining the upper respiratory tract. The tissues become irritated and inflamed and often become very moist. Mucus production is also common. A cold is contagious. You can easily spread the virus to others by oral contact. This includes kissing, sharing a glass, coughing, or sneezing. Touching your mouth or nose and then touching a surface, which is then touched by another person, can also spread the virus. SYMPTOMS  Symptoms typically develop 1 to 3 days after you come in contact with a cold virus. Symptoms vary from person to person. They may include:  Runny nose.  Sneezing.  Nasal congestion.  Sinus irritation.  Sore throat.  Loss of voice (laryngitis).  Cough.  Fatigue.  Muscle aches.  Loss of appetite.  Headache.  Low-grade fever. DIAGNOSIS  You might diagnose your own cold based on familiar symptoms, since most people get a cold 2 to 3 times a year. Your caregiver can confirm this based on your exam. Most importantly, your caregiver can check that your symptoms are not due to another disease such as strep throat, sinusitis, pneumonia, asthma, or epiglottitis. Blood tests, throat tests, and X-rays are not necessary to diagnose a common cold, but they may sometimes be helpful in excluding other more serious diseases. Your caregiver will decide if any further tests are required. RISKS AND COMPLICATIONS  You may be at risk for a more severe case of the common cold if you smoke cigarettes, have chronic heart disease (such as heart failure) or lung disease (such as asthma), or if  you have a weakened immune system. The very young and very old are also at risk for more serious infections. Bacterial sinusitis, middle ear infections, and bacterial pneumonia can complicate the common cold. The common cold can worsen asthma and chronic obstructive pulmonary disease (COPD). Sometimes, these complications can require emergency medical care and may be life-threatening. PREVENTION  The best way to protect against getting a cold is to practice good hygiene. Avoid oral or hand contact with people with cold symptoms. Wash your hands often if contact occurs. There is no clear evidence that vitamin C, vitamin E, echinacea, or exercise reduces the chance of developing a cold. However, it is always recommended to get plenty of rest and practice good nutrition. TREATMENT  Treatment is directed at relieving symptoms. There is no cure. Antibiotics are not effective, because the infection is caused by a virus, not by bacteria. Treatment may include:  Increased fluid intake. Sports drinks offer valuable electrolytes, sugars, and fluids.  Breathing heated mist or steam (vaporizer or shower).  Eating chicken soup or other clear broths, and maintaining good nutrition.  Getting plenty of rest.  Using gargles or lozenges for comfort.  Controlling fevers with ibuprofen or acetaminophen as directed by your caregiver.  Increasing usage of your inhaler if you have asthma. Zinc gel and zinc lozenges, taken in the first 24 hours of the common cold, can shorten the duration and lessen the severity of symptoms. Pain medicines may help with fever, muscle aches, and throat pain. A variety of non-prescription medicines are available to treat congestion and runny nose. Your caregiver   can make recommendations and may suggest nasal or lung inhalers for other symptoms.  HOME CARE INSTRUCTIONS   Only take over-the-counter or prescription medicines for pain, discomfort, or fever as directed by your  caregiver.  Use a warm mist humidifier or inhale steam from a shower to increase air moisture. This may keep secretions moist and make it easier to breathe.  Drink enough water and fluids to keep your urine clear or pale yellow.  Rest as needed.  Return to work when your temperature has returned to normal or as your caregiver advises. You may need to stay home longer to avoid infecting others. You can also use a face mask and careful hand washing to prevent spread of the virus. SEEK MEDICAL CARE IF:   After the first few days, you feel you are getting worse rather than better.  You need your caregiver's advice about medicines to control symptoms.  You develop chills, worsening shortness of breath, or brown or red sputum. These may be signs of pneumonia.  You develop yellow or brown nasal discharge or pain in the face, especially when you bend forward. These may be signs of sinusitis.  You develop a fever, swollen neck glands, pain with swallowing, or white areas in the back of your throat. These may be signs of strep throat. SEEK IMMEDIATE MEDICAL CARE IF:   You have a fever.  You develop severe or persistent headache, ear pain, sinus pain, or chest pain.  You develop wheezing, a prolonged cough, cough up blood, or have a change in your usual mucus (if you have chronic lung disease).  You develop sore muscles or a stiff neck. Document Released: 11/17/2000 Document Revised: 08/16/2011 Document Reviewed: 08/29/2013 ExitCare Patient Information 2015 [employer]. This information is not intended to replace advice given to you by your health care provider. Make sure you discuss any questions you have with your health care provider.  

## 2015-02-06 NOTE — Assessment & Plan Note (Signed)
Deteriorated- support offered today Will increase Wellbutrin to 300 mg daily New RX sent to pharmacy  Advised her to follow up with PCP in 1 month

## 2015-02-07 LAB — POCT RAPID STREP A (OFFICE): RAPID STREP A SCREEN: NEGATIVE

## 2015-02-07 NOTE — Addendum Note (Signed)
Addended by: Lurlean Nanny on: 02/07/2015 09:24 AM   Modules accepted: Orders

## 2015-03-18 ENCOUNTER — Other Ambulatory Visit: Payer: Self-pay | Admitting: Family Medicine

## 2015-03-18 NOTE — Telephone Encounter (Signed)
Electronically refill request  Last prescribed on 01/06/15. Last seen for acute 02/06/15. No future apt.

## 2015-03-19 NOTE — Telephone Encounter (Signed)
[facility]

## 2015-04-16 ENCOUNTER — Other Ambulatory Visit: Payer: Self-pay | Admitting: Family Medicine

## 2015-04-17 NOTE — Telephone Encounter (Signed)
Last f/u 06/2014-CPE 

## 2015-04-17 NOTE — Telephone Encounter (Signed)
Rx called in to requested pharmacy 

## 2015-05-01 ENCOUNTER — Other Ambulatory Visit: Payer: Self-pay | Admitting: Family Medicine

## 2015-05-05 NOTE — Telephone Encounter (Signed)
Pt left v/m requesting status of BC pill refill; left v/m per DPR that refill already done and pt to ck with pharmacy.

## 2015-05-11 ENCOUNTER — Other Ambulatory Visit: Payer: Self-pay | Admitting: Internal Medicine

## 2015-06-17 ENCOUNTER — Encounter: Payer: Self-pay | Admitting: Family Medicine

## 2015-06-17 ENCOUNTER — Encounter: Payer: BC Managed Care – PPO | Admitting: Family Medicine

## 2015-06-17 ENCOUNTER — Ambulatory Visit (INDEPENDENT_AMBULATORY_CARE_PROVIDER_SITE_OTHER): Payer: BC Managed Care – PPO | Admitting: Family Medicine

## 2015-06-17 VITALS — BP 130/86 | HR 96 | Temp 98.4°F | Ht 64.0 in | Wt 218.2 lb

## 2015-06-17 DIAGNOSIS — Z1239 Encounter for other screening for malignant neoplasm of breast: Secondary | ICD-10-CM | POA: Diagnosis not present

## 2015-06-17 DIAGNOSIS — F418 Other specified anxiety disorders: Secondary | ICD-10-CM

## 2015-06-17 DIAGNOSIS — Z0000 Encounter for general adult medical examination without abnormal findings: Secondary | ICD-10-CM | POA: Diagnosis not present

## 2015-06-17 DIAGNOSIS — I10 Essential (primary) hypertension: Secondary | ICD-10-CM

## 2015-06-17 DIAGNOSIS — Z01419 Encounter for gynecological examination (general) (routine) without abnormal findings: Secondary | ICD-10-CM

## 2015-06-17 DIAGNOSIS — F411 Generalized anxiety disorder: Secondary | ICD-10-CM

## 2015-06-17 LAB — COMPREHENSIVE METABOLIC PANEL
ALT: 12 U/L (ref 0–35)
AST: 16 U/L (ref 0–37)
Albumin: 4.4 g/dL (ref 3.5–5.2)
Alkaline Phosphatase: 41 U/L (ref 39–117)
BILIRUBIN TOTAL: 0.7 mg/dL (ref 0.2–1.2)
BUN: 15 mg/dL (ref 6–23)
CO2: 23 meq/L (ref 19–32)
CREATININE: 0.83 mg/dL (ref 0.40–1.20)
Calcium: 9.2 mg/dL (ref 8.4–10.5)
Chloride: 104 mEq/L (ref 96–112)
GFR: 79.35 mL/min (ref >60.00)
GLUCOSE: 87 mg/dL (ref 70–99)
Potassium: 4.2 mEq/L (ref 3.5–5.1)
SODIUM: 136 meq/L (ref 135–145)
TOTAL PROTEIN: 7.1 g/dL (ref 6.0–8.3)

## 2015-06-17 LAB — CBC WITH DIFFERENTIAL/PLATELET
BASOS ABS: 0 10*3/uL (ref 0.0–0.1)
Basophils Relative: 0.5 % (ref 0.0–3.0)
EOS ABS: 0.1 10*3/uL (ref 0.0–0.7)
Eosinophils Relative: 1.4 % (ref 0.0–5.0)
HCT: 40.3 % (ref 36.0–46.0)
Hemoglobin: 13.7 g/dL (ref 12.0–15.0)
LYMPHS ABS: 2.5 10*3/uL (ref 0.7–4.0)
Lymphocytes Relative: 33.2 % (ref 12.0–46.0)
MCHC: 34 g/dL (ref 30.0–36.0)
MCV: 97.7 fl (ref 78.0–100.0)
MONO ABS: 0.6 10*3/uL (ref 0.1–1.0)
Monocytes Relative: 7.3 % (ref 3.0–12.0)
NEUTROS ABS: 4.4 10*3/uL (ref 1.4–7.7)
NEUTROS PCT: 57.6 % (ref 43.0–77.0)
PLATELETS: 232 10*3/uL (ref 150.0–400.0)
RBC: 4.13 Mil/uL (ref 3.87–5.11)
RDW: 13.2 % (ref 11.5–15.5)
WBC: 7.6 10*3/uL (ref 4.0–10.5)

## 2015-06-17 LAB — TSH: TSH: 1.5 u[IU]/mL (ref 0.35–4.50)

## 2015-06-17 LAB — LIPID PANEL
CHOL/HDL RATIO: 2
Cholesterol: 154 mg/dL (ref 0–200)
HDL: 70.6 mg/dL (ref >39.00)
LDL Cholesterol: 67 mg/dL (ref 0–99)
NONHDL: 83.04
Triglycerides: 82 mg/dL (ref 0.0–149.0)
VLDL: 16.4 mg/dL (ref 0.0–40.0)

## 2015-06-17 NOTE — Progress Notes (Signed)
Subjective:    Patient ID: Amber Frost, female    DOB: 1971/04/10, 45 y.o.   MRN: [id number]  HPI  45 yo pleasant female here for CPX and follow up of chronic medical conditions.  Last pap smear was done by me on 06/12/14.  She would like pap smear today. No h/o abnormal pap smears but maternal grandmother died of uterine (not sure if was cervical) CA in her early 39s.  She is on OCPs- h/o menorrhagia.  Mammogram 05/10/12.  HTN- controlled with current dose of lisinopril.  No CP, SOB, blurred vision, or HA.  Lab Results  Component Value Date   CREATININE 0.7 06/12/2014    Weaned herself off Pristiq in 01/2014.  Felt it was not helping with her anxiety or depression.  Last year, 06/2014, she was endorsing more tearfulness and anhedonia. Started Wellbutrin 150 mg XL daily at that time, increased in 300 mg XL daily.  She feels this is working well and has also helped her lose weight. Wt Readings from Last 3 Encounters:  06/17/15 218 lb 4 oz (98.998 kg)  02/06/15 224 lb (101.606 kg)  09/13/14 234 lb 4 oz (106.255 kg)    Lab Results  Component Value Date   WBC 7.4 06/12/2014   HGB 14.2 06/12/2014   HCT 41.5 06/12/2014   MCV 95.1 06/12/2014   PLT 208.0 06/12/2014   Lab Results  Component Value Date   CHOL 193 06/12/2014   HDL 64.30 06/12/2014   LDLCALC 98 06/12/2014   LDLDIRECT 109.9 02/03/2010   TRIG 154.0* 06/12/2014   CHOLHDL 3 06/12/2014   Lab Results  Component Value Date   NA 137 06/12/2014   K 4.1 06/12/2014   CL 106 06/12/2014   CO2 25 06/12/2014   Lab Results  Component Value Date   TSH 1.16 06/12/2014   Lab Results  Component Value Date   ALT 26 06/12/2014   AST 27 06/12/2014   ALKPHOS 44 06/12/2014   BILITOT 0.9 06/12/2014    Patient Active Problem List   Diagnosis Date Noted  . Well woman exam with routine gynecological exam 06/12/2014  . GERD (gastroesophageal reflux disease) 05/25/2013  . Menorrhagia 05/09/2012  . LICHEN PLANUS [id number]   . Anxiety state 08/06/2009  . Depression with anxiety 08/06/2009  . Essential hypertension 08/06/2009   Past Medical History  Diagnosis Date  . Anxiety   . Depression   . Hypertension    Past Surgical History  Procedure Laterality Date  . Nasal sinus surgery    . Nasal sinus surgery     Social History  Substance Use Topics  . Smoking status: Never Smoker   . Smokeless tobacco: Never Used  . Alcohol Use: 0.0 oz/week    0 Standard drinks or equivalent per week   Family History  Problem Relation Age of Onset  . Diabetes Other   . Hypertension Other    Allergies  Allergen Reactions  . Sulfa Antibiotics Rash   Current Outpatient Prescriptions on File Prior to Visit  Medication Sig Dispense Refill  . ALPRAZolam (XANAX) 1 MG tablet TAKE 1 TABLET BY MOUTH 3 TIMES A DAY AS NEEDED 60 tablet 0  . buPROPion (WELLBUTRIN XL) 300 MG 24 hr tablet TAKE 1 TABLET (300 MG TOTAL) BY MOUTH DAILY. 30 tablet 1  . lisinopril (PRINIVIL,ZESTRIL) 20 MG tablet TAKE 1 TABLET BY MOUTH EVERY DAY 90 tablet 1  . Norethindrone-Ethinyl Estradiol-Fe Biphas (LO LOESTRIN FE) 1 MG-10 MCG / 10 MCG tablet Take  1 tablet by mouth daily. COMPLETE PHYSICAL EXAM REQUIRED WHEN REFILLS HAVE EXPIRED 28 tablet 1   No current facility-administered medications on file prior to visit.   The PMH, PSH, Social History, Family History, Medications, and allergies have been reviewed in [facility], and have been updated if relevant.   Review of Systems  Constitutional: Negative.   HENT: Negative.   Eyes: Negative.   Respiratory: Negative.   Cardiovascular: Negative.   Gastrointestinal: Negative.   Endocrine: Negative.   Genitourinary: Negative.   Musculoskeletal: Negative.   Skin: Negative.   Allergic/Immunologic: Negative.   Neurological: Negative.   Hematological: Negative.   Psychiatric/Behavioral: Negative for suicidal ideas, hallucinations, behavioral problems, sleep disturbance, self-injury, dysphoric mood, decreased  concentration and agitation. The patient is not nervous/anxious and is not hyperactive.    See HPI     Objective:   Physical Exam BP 130/86 mmHg  Pulse 96  Temp(Src) 98.4 F (36.9 C) (Oral)  Ht 5\' 4"  (1.626 m)  Wt 218 lb 4 oz (98.998 kg)  BMI 37.44 kg/m2  Wt Readings from Last 3 Encounters:  06/17/15 218 lb 4 oz (98.998 kg)  02/06/15 224 lb (101.606 kg)  09/13/14 234 lb 4 oz (106.255 kg)     General:  Obese ,well-nourished,in no acute distress; alert,appropriate and cooperative throughout examination Head:  normocephalic and atraumatic.   Eyes:  vision grossly intact, pupils equal, pupils round, and pupils reactive to light.   Ears:  R ear normal and L ear normal.   Nose:  no external deformity.   Mouth:  good dentition.   Neck:  No deformities, masses, or tenderness noted. Breasts:  No mass, nodules, thickening, tenderness, bulging, retraction, inflamation, nipple discharge or skin changes noted.   Lungs:  Normal respiratory effort, chest expands symmetrically. Lungs are clear to auscultation, no crackles or wheezes. Heart:  Normal rate and regular rhythm. S1 and S2 normal without gallop, murmur, click, rub or other extra sounds. Abdomen:  Bowel sounds positive,abdomen soft and non-tender without masses, organomegaly or hernias noted. Msk:  No deformity or scoliosis noted of thoracic or lumbar spine.   Extremities:  No clubbing, cyanosis, edema, or deformity noted with normal full range of motion of all joints.   Neurologic:  alert & oriented X3 and gait normal.   Skin:  Intact without suspicious lesions or rashes Cervical Nodes:  No lymphadenopathy noted Axillary Nodes:  No palpable lymphadenopathy Psych:  Cognition and judgment appear intact. Alert and cooperative with normal attention span and concentration. No apparent delusions, illusions, hallucinations     Assessment & Plan:

## 2015-06-17 NOTE — Assessment & Plan Note (Signed)
Reviewed preventive care protocols, scheduled due services, and updated immunizations Discussed nutrition, exercise, diet, and healthy lifestyle.  Labs today.  Mammogram ordered- pt to schedule.

## 2015-06-17 NOTE — Progress Notes (Signed)
Pre visit review using our clinic review tool, if applicable. No additional management support is needed unless otherwise documented below in the visit note. 

## 2015-06-17 NOTE — Assessment & Plan Note (Signed)
Well controlled on current rxs. No changes made today. 

## 2015-06-17 NOTE — Assessment & Plan Note (Signed)
Well controlled on current dose of lisinopril. No changes made today. 

## 2015-06-17 NOTE — Patient Instructions (Signed)
Great to see you. We will call you with your lab results and you can view them online.  Please call [facility] to schedule your mammogram.

## 2015-06-17 NOTE — Assessment & Plan Note (Signed)
Improving.  Congratulated her on success and lifestyle changes.  Encouraged adding more physical activity- at 30 minutes of cardio daily.

## 2015-06-17 NOTE — Addendum Note (Signed)
Addended by: Daralene Milch C on: 06/17/2015 04:17 PM   Modules accepted: Miquel Dunn

## 2015-06-18 ENCOUNTER — Encounter: Payer: Self-pay | Admitting: *Deleted

## 2015-06-18 ENCOUNTER — Ambulatory Visit
Admission: RE | Admit: 2015-06-18 | Discharge: 2015-06-18 | Disposition: A | Discharge status: Home / Self Care | Payer: BC Managed Care – PPO | Source: Ambulatory Visit | Attending: Family Medicine | Admitting: Family Medicine

## 2015-06-18 DIAGNOSIS — Z1239 Encounter for other screening for malignant neoplasm of breast: Secondary | ICD-10-CM

## 2015-06-18 DIAGNOSIS — Z1231 Encounter for screening mammogram for malignant neoplasm of breast: Secondary | ICD-10-CM | POA: Diagnosis present

## 2015-06-19 ENCOUNTER — Other Ambulatory Visit: Payer: Self-pay | Admitting: *Deleted

## 2015-06-19 MED ORDER — ALPRAZOLAM 1 MG PO TABS: 1.0000 mg | ORAL_TABLET | Freq: Three times a day (TID) | ORAL | Status: DC | PRN

## 2015-06-19 NOTE — Telephone Encounter (Signed)
Last f/u 06/2015 

## 2015-06-20 NOTE — Telephone Encounter (Signed)
Rx called in to requested pharmacy 

## 2015-06-24 ENCOUNTER — Other Ambulatory Visit: Payer: Self-pay | Admitting: *Deleted

## 2015-06-24 MED ORDER — NORETHIN-ETH ESTRAD-FE BIPHAS 1 MG-10 MCG / 10 MCG PO TABS: 1.0000 | ORAL_TABLET | Freq: Every day | ORAL | Status: DC

## 2015-07-08 ENCOUNTER — Other Ambulatory Visit: Payer: Self-pay | Admitting: Internal Medicine

## 2015-07-11 ENCOUNTER — Other Ambulatory Visit: Payer: Self-pay | Admitting: *Deleted

## 2015-07-11 MED ORDER — LISINOPRIL 20 MG PO TABS: 20.0000 mg | ORAL_TABLET | Freq: Every day | ORAL | Status: DC

## 2015-07-18 ENCOUNTER — Other Ambulatory Visit: Payer: Self-pay | Admitting: Family Medicine

## 2015-07-19 NOTE — Telephone Encounter (Signed)
Last filled 06/19/2015--please advise

## 2015-07-21 NOTE — Telephone Encounter (Signed)
Rx called in to pharmacy. 

## 2015-08-19 ENCOUNTER — Other Ambulatory Visit: Payer: Self-pay | Admitting: Family Medicine

## 2015-08-19 NOTE — Telephone Encounter (Signed)
Rx called in to requested pharmacy 

## 2015-08-19 NOTE — Telephone Encounter (Signed)
Last f/u 06/2015 

## 2015-09-03 ENCOUNTER — Telehealth: Payer: Self-pay

## 2015-09-03 MED ORDER — FLUCONAZOLE 150 MG PO TABS: 150.0000 mg | ORAL_TABLET | Freq: Once | ORAL | Status: AC

## 2015-09-03 MED ORDER — TERCONAZOLE 0.4 % VA CREA: 1.0000 | TOPICAL_CREAM | Freq: Every day | VAGINAL | Status: DC

## 2015-09-03 NOTE — Telephone Encounter (Signed)
Pt left v/m; pt has been taking abx for 10 days that was given by [facility] walk in and now has yeast infection, vaginal and perineal itching and burning; no vaginal discharge noted.pt has not tried OTC meds. In past OTC is not effective. Pt request diflucan and something topical for relief of burning and itching to [facility].

## 2015-09-03 NOTE — Telephone Encounter (Signed)
eRx sent  Needs to be seen if symptoms do not resolve. 

## 2015-10-30 ENCOUNTER — Telehealth: Payer: Self-pay

## 2015-10-30 MED ORDER — ALPRAZOLAM ER 1 MG PO TB24: 1.0000 mg | ORAL_TABLET | Freq: Every day | ORAL | Status: DC

## 2015-10-30 NOTE — Telephone Encounter (Signed)
Pt left v/m requesting alprazolam to be changed to extended release. Pt request cb. [facility]. Last refilled alprazolam # 60 on 08/19/15. Last annual exam on 06/17/15.

## 2015-10-30 NOTE — Telephone Encounter (Signed)
Rx changed, printed and in my box.

## 2015-10-30 NOTE — Telephone Encounter (Signed)
Rx faxed to requested pharmacy 

## 2015-11-05 ENCOUNTER — Other Ambulatory Visit: Payer: Self-pay | Admitting: Family Medicine

## 2015-11-07 ENCOUNTER — Other Ambulatory Visit: Payer: Self-pay | Admitting: Family Medicine

## 2015-11-07 NOTE — Telephone Encounter (Signed)
Contacted pharmacy who states they did not receive Rx although we have confirmation of receipt. Verbal orders given

## 2015-12-16 ENCOUNTER — Other Ambulatory Visit: Payer: Self-pay | Admitting: Family Medicine

## 2015-12-17 NOTE — Telephone Encounter (Signed)
Last f/u 06/2015 

## 2015-12-17 NOTE — Telephone Encounter (Signed)
Rx called in to requested pharmacy 

## 2016-01-31 ENCOUNTER — Other Ambulatory Visit: Payer: Self-pay | Admitting: Family Medicine

## 2016-02-02 NOTE — Telephone Encounter (Signed)
Rx called in to requested pharmacy 

## 2016-02-02 NOTE — Telephone Encounter (Signed)
Last f/u 06/2015 

## 2016-03-05 ENCOUNTER — Other Ambulatory Visit: Payer: Self-pay

## 2016-03-05 ENCOUNTER — Other Ambulatory Visit: Payer: Self-pay | Admitting: Family Medicine

## 2016-03-05 MED ORDER — NORETHIN-ETH ESTRAD-FE BIPHAS 1 MG-10 MCG / 10 MCG PO TABS: 1.0000 | ORAL_TABLET | Freq: Every day | ORAL | 6 refills | Status: DC

## 2016-03-05 NOTE — Telephone Encounter (Signed)
[id number] [place], [place] - [address] [phone]   

## 2016-03-05 NOTE — Telephone Encounter (Signed)
Last filled on 02/02/16, last OV 06/17/15. Ok to refill?

## 2016-04-14 ENCOUNTER — Other Ambulatory Visit: Payer: Self-pay | Admitting: Family Medicine

## 2016-04-14 NOTE — Telephone Encounter (Signed)
Rx called in to requested pharmacy 

## 2016-04-14 NOTE — Telephone Encounter (Signed)
Last f/u 06/2015-CPE 

## 2016-05-15 ENCOUNTER — Other Ambulatory Visit: Payer: Self-pay | Admitting: Family Medicine

## 2016-05-17 NOTE — Telephone Encounter (Signed)
Last filled 04-14-16 #60 Last OV 06-17-15 No Future OV

## 2016-05-18 NOTE — Telephone Encounter (Signed)
Rx called in to requested pharmacy 

## 2016-07-02 ENCOUNTER — Other Ambulatory Visit: Payer: Self-pay | Admitting: Family Medicine

## 2016-07-02 NOTE — Telephone Encounter (Signed)
Last f/u 06/2015 

## 2016-07-02 NOTE — Telephone Encounter (Signed)
Rx called in to requested pharmacy 

## 2016-07-08 ENCOUNTER — Other Ambulatory Visit: Payer: Self-pay | Admitting: Family Medicine

## 2016-07-10 ENCOUNTER — Other Ambulatory Visit: Payer: Self-pay | Admitting: Family Medicine

## 2016-08-03 ENCOUNTER — Other Ambulatory Visit: Payer: Self-pay | Admitting: Family Medicine

## 2016-08-08 ENCOUNTER — Other Ambulatory Visit: Payer: Self-pay | Admitting: Family Medicine

## 2016-08-10 ENCOUNTER — Encounter: Payer: Self-pay | Admitting: Family Medicine

## 2016-08-10 ENCOUNTER — Ambulatory Visit (INDEPENDENT_AMBULATORY_CARE_PROVIDER_SITE_OTHER): Payer: BC Managed Care – PPO | Admitting: Family Medicine

## 2016-08-10 VITALS — BP 138/90 | HR 98 | Temp 98.4°F | Ht 64.0 in | Wt 217.0 lb

## 2016-08-10 DIAGNOSIS — F411 Generalized anxiety disorder: Secondary | ICD-10-CM | POA: Diagnosis not present

## 2016-08-10 DIAGNOSIS — R21 Rash and other nonspecific skin eruption: Secondary | ICD-10-CM | POA: Insufficient documentation

## 2016-08-10 DIAGNOSIS — I10 Essential (primary) hypertension: Secondary | ICD-10-CM | POA: Diagnosis not present

## 2016-08-10 DIAGNOSIS — Z0000 Encounter for general adult medical examination without abnormal findings: Secondary | ICD-10-CM

## 2016-08-10 DIAGNOSIS — Z01419 Encounter for gynecological examination (general) (routine) without abnormal findings: Secondary | ICD-10-CM

## 2016-08-10 DIAGNOSIS — L439 Lichen planus, unspecified: Secondary | ICD-10-CM | POA: Diagnosis not present

## 2016-08-10 LAB — CBC WITH DIFFERENTIAL/PLATELET
Basophils Absolute: 0.1 10*3/uL (ref 0.0–0.1)
Basophils Relative: 0.8 % (ref 0.0–3.0)
EOS PCT: 1.3 % (ref 0.0–5.0)
Eosinophils Absolute: 0.1 10*3/uL (ref 0.0–0.7)
HEMATOCRIT: 39.8 % (ref 36.0–46.0)
HEMOGLOBIN: 13.7 g/dL (ref 12.0–15.0)
LYMPHS PCT: 43.4 % (ref 12.0–46.0)
Lymphs Abs: 2.8 10*3/uL (ref 0.7–4.0)
MCHC: 34.5 g/dL (ref 30.0–36.0)
MCV: 96.8 fl (ref 78.0–100.0)
MONO ABS: 0.5 10*3/uL (ref 0.1–1.0)
MONOS PCT: 8.3 % (ref 3.0–12.0)
Neutro Abs: 3 10*3/uL (ref 1.4–7.7)
Neutrophils Relative %: 46.2 % (ref 43.0–77.0)
Platelets: 230 10*3/uL (ref 150.0–400.0)
RBC: 4.11 Mil/uL (ref 3.87–5.11)
RDW: 12.4 % (ref 11.5–15.5)
WBC: 6.5 10*3/uL (ref 4.0–10.5)

## 2016-08-10 LAB — COMPREHENSIVE METABOLIC PANEL
ALK PHOS: 41 U/L (ref 39–117)
ALT: 16 U/L (ref 0–35)
AST: 17 U/L (ref 0–37)
Albumin: 4.4 g/dL (ref 3.5–5.2)
BUN: 10 mg/dL (ref 6–23)
CO2: 26 mEq/L (ref 19–32)
Calcium: 9.5 mg/dL (ref 8.4–10.5)
Chloride: 105 mEq/L (ref 96–112)
Creatinine, Ser: 0.76 mg/dL (ref 0.40–1.20)
GFR: 87.39 mL/min (ref >60.00)
GLUCOSE: 90 mg/dL (ref 70–99)
POTASSIUM: 4 meq/L (ref 3.5–5.1)
Sodium: 137 mEq/L (ref 135–145)
TOTAL PROTEIN: 7.3 g/dL (ref 6.0–8.3)
Total Bilirubin: 0.6 mg/dL (ref 0.2–1.2)

## 2016-08-10 LAB — LIPID PANEL
CHOLESTEROL: 157 mg/dL (ref 0–200)
HDL: 65.9 mg/dL (ref >39.00)
LDL CALC: 72 mg/dL (ref 0–99)
NonHDL: 90.99
Total CHOL/HDL Ratio: 2
Triglycerides: 97 mg/dL (ref 0.0–149.0)
VLDL: 19.4 mg/dL (ref 0.0–40.0)

## 2016-08-10 LAB — TSH: TSH: 1.71 u[IU]/mL (ref 0.35–4.50)

## 2016-08-10 MED ORDER — CLOBETASOL PROPIONATE 0.05 % EX CREA: 1.0000 "application " | TOPICAL_CREAM | Freq: Every day | CUTANEOUS | 0 refills | Status: DC

## 2016-08-10 MED ORDER — ALPRAZOLAM 1 MG PO TABS: 1.0000 mg | ORAL_TABLET | Freq: Three times a day (TID) | ORAL | 0 refills | Status: DC | PRN

## 2016-08-10 NOTE — Patient Instructions (Addendum)
Great to see you. I will call you with your lab results.  Please schedule your mammogram.

## 2016-08-10 NOTE — Assessment & Plan Note (Signed)
Reviewed preventive care protocols, scheduled due services, and updated immunizations Discussed nutrition, exercise, diet, and healthy lifestyle.  Orders Placed This Encounter  Procedures  . CBC with Differential/Platelet  . Lipid panel  . TSH  . Comprehensive metabolic panel   Pt will call to schedule mammogram.

## 2016-08-10 NOTE — Assessment & Plan Note (Signed)
Well controlled with current rx. Xanax refill called in today.

## 2016-08-10 NOTE — Progress Notes (Signed)
Subjective:   Patient ID: Amber Frost, female    DOB: [birth year], 46 y.o.   MRN: [id number]  Amber Frost is a pleasant 46 y.o. year old female who presents to clinic today with Annual Exam (Discuss alprazolam. Dosage was changed in our system last refill.); Hypertension; Pain (Saw an orthopedic specialist for joint pain all over.); and Rash  on 08/10/2016  HPI:  Last pap smear was done by me on 06/12/14.   She would like pap smear today. No h/o abnormal pap smears but maternal grandmother died of uterine (not sure if was cervical) CA in her early 5s.  She is on OCPs- h/o menorrhagia.  Mammogram 06/18/15  HTN- controlled with current dose of lisinopril.  No CP, SOB, blurred vision, or HA.  Lab Results  Component Value Date   CREATININE 0.83 06/17/2015   Due for labs.  Current Outpatient Prescriptions on File Prior to Visit  Medication Sig Dispense Refill  . buPROPion (WELLBUTRIN XL) 300 MG 24 hr tablet TAKE 1 TABLET (300 MG TOTAL) BY MOUTH DAILY. 30 tablet 0  . lisinopril (PRINIVIL,ZESTRIL) 20 MG tablet TAKE 1 TABLET (20 MG TOTAL) BY MOUTH DAILY. OFFICE VISIT REQUIRED FOR ADDITIONAL REFILLS 30 tablet 0  . Norethindrone-Ethinyl Estradiol-Fe Biphas (LO LOESTRIN FE) 1 MG-10 MCG / 10 MCG tablet Take 1 tablet by mouth daily. COMPLETE PHYSICAL EXAM REQUIRED WHEN REFILLS HAVE EXPIRED 28 tablet 6  . ALPRAZolam (XANAX XR) 1 MG 24 hr tablet Take 1 tablet (1 mg total) by mouth daily. 30 tablet 0  . ALPRAZolam (XANAX) 1 MG tablet TAKE 1 TABLET THREE TIMES A DAY AS NEEDED 60 tablet 0   No current facility-administered medications on file prior to visit.     Allergies  Allergen Reactions  . Sulfa Antibiotics Rash    Past Medical History:  Diagnosis Date  . Anxiety   . Depression   . Hypertension     Past Surgical History:  Procedure Laterality Date  . NASAL SINUS SURGERY    . NASAL SINUS SURGERY      Family History  Problem Relation Age of Onset  . Diabetes Other   .  Hypertension Other   . Breast cancer Paternal Aunt 12    Social History   Social History  . Marital status: Married    Spouse name: N/A  . Number of children: 1  . Years of education: N/A   Occupational History  . Director of Education    Social History Main Topics  . Smoking status: Never Smoker  . Smokeless tobacco: Never Used  . Alcohol use 0.0 oz/week  . Drug use: No  . Sexual activity: Not on file   Other Topics Concern  . Not on file   Social History Narrative  . No narrative on file   The PMH, PSH, Social History, Family History, Medications, and allergies have been reviewed in [facility], and have been updated if relevant.   Review of Systems  Constitutional: Negative.   HENT: Negative.   Eyes: Negative.   Respiratory: Negative.   Cardiovascular: Negative.   Gastrointestinal: Negative.   Endocrine: Negative.   Genitourinary: Negative.   Musculoskeletal: Negative.   Allergic/Immunologic: Negative.   Neurological: Negative.   Hematological: Negative.   Psychiatric/Behavioral: Negative.   All other systems reviewed and are negative.      Objective:    BP 138/90 (BP Location: Left Arm, Patient Position: Sitting, Cuff Size: Large)   Pulse 98   Temp 98.4 F (  36.9 C) (Oral)   Ht 5\' 4"  (1.626 m)   Wt 217 lb (98.4 kg)   SpO2 98%   BMI 37.25 kg/m    Physical Exam   General:  Well-developed,well-nourished,in no acute distress; alert,appropriate and cooperative throughout examination Head:  normocephalic and atraumatic.   Eyes:  vision grossly intact, PERRL Ears:  R ear normal and L ear normal externally, TMs clear bilaterally Nose:  no external deformity.   Mouth:  good dentition.   Neck:  No deformities, masses, or tenderness noted. Breasts:  No mass, nodules, thickening, tenderness, bulging, retraction, inflamation, nipple discharge or skin changes noted.   Lungs:  Normal respiratory effort, chest expands symmetrically. Lungs are clear to auscultation,  no crackles or wheezes. Heart:  Normal rate and regular rhythm. S1 and S2 normal without gallop, murmur, click, rub or other extra sounds. Abdomen:  Bowel sounds positive,abdomen soft and non-tender without masses, organomegaly or hernias noted. Msk:  No deformity or scoliosis noted of thoracic or lumbar spine.   Extremities:  No clubbing, cyanosis, edema, or deformity noted with normal full range of motion of all joints.   Neurologic:  alert & oriented X3 and gait normal.   Skin:  Intact without suspicious lesions or rashes Cervical Nodes:  No lymphadenopathy noted Axillary Nodes:  No palpable lymphadenopathy Psych:  Cognition and judgment appear intact. Alert and cooperative with normal attention span and concentration. No apparent delusions, illusions, hallucinations       Assessment & Plan:   Well woman exam with routine gynecological exam - Plan: CBC with Differential/Platelet, Lipid panel, TSH, Comprehensive metabolic panel  Essential hypertension No Follow-up on file.

## 2016-08-10 NOTE — Assessment & Plan Note (Signed)
Blood pressure controlled on current rx.

## 2016-08-10 NOTE — Assessment & Plan Note (Signed)
eRx refill sent for clobetasol.

## 2016-08-10 NOTE — Progress Notes (Signed)
Pre visit review using our clinic review tool, if applicable. No additional management support is needed unless otherwise documented below in the visit note. 

## 2016-08-14 ENCOUNTER — Other Ambulatory Visit: Payer: Self-pay | Admitting: Family Medicine

## 2016-08-16 ENCOUNTER — Other Ambulatory Visit: Payer: Self-pay | Admitting: Family Medicine

## 2016-08-16 ENCOUNTER — Telehealth: Payer: Self-pay

## 2016-08-16 NOTE — Telephone Encounter (Signed)
Pt called to ck on refill;advised done and pt will ck with pharmacy.

## 2016-08-16 NOTE — Telephone Encounter (Signed)
Refill already done. 

## 2016-08-16 NOTE — Telephone Encounter (Signed)
PLEASE NOTE: All timestamps contained within this report are represented as [place] Standard Time. CONFIDENTIALTY NOTICE: This fax transmission is intended only for the addressee. It contains information that is legally privileged, confidential or otherwise protected from use or disclosure. If you are not the intended recipient, you are strictly prohibited from reviewing, disclosing, copying using or disseminating any of this information or taking any action in reliance on or regarding this information. If you have received this fax in error, please notify us immediately by telephone so that we can arrange for its return to [place]. Phone: [phone], Toll-Free: [phone], Fax: [phone] Page: 1 of 2 Call Id: [id number] [place] Patient Name: Amber Frost Gender: Female DOB: 07-12-1970 Age: 46 Y 2 M 18 D Return Phone Number: [phone] (Primary) City/State/Zip: [place] [address] Client [facility] - Client Client Site [facility] - Day Physician Arnette Norris - MD Who Is Calling Patient / Member / Family / Caregiver Call Type Triage / Clinical Relationship To Patient Self Return Phone Number [phone] (Primary) Chief Complaint Prescription Refill or Medication Request (non symptomatic) Reason for Call Medication Question / Request Initial Comment Caller needs a Rx refilled and needs authorization, Wellbutrin Appointment Disposition EMR Appointment Not Necessary Info pasted into Epic No Nurse Assessment Nurse: Thad Ranger, RN, Langley Gauss Date/Time (Eastern Time): 08/15/2016 10:36:39 AM Please select the assessment type ---RX called in but not at pharm Additional Documentation ---Pt request refill for Wellbutrin 300mg  po qd. Last tab on Friday. Seen by MD on 08/10/16. Denies new/worsening s/s at this time. Document the name of the medication. ---See  note Pharmacy name and phone number. ---na Has the office closed within the last 30 minutes? ---No Does the client directives allow for assistance with medications after hours? ---No Additional Documentation ---Advised refills are not called in after hrs and will need to call the [facility] in the am during reg bus hrs for the needed refill. Verb understanding. Guidelines Guideline Title Affirmed Question Disp. Time Eilene Ghazi Time) Disposition Final User 08/15/2016 10:40:44 AM Clinical Call Yes Thad Ranger, RN, Langley Gauss Comments User: Romeo Apple, RN Date/Time Eilene Ghazi Time): 08/15/2016 10:41:38 AM Record should have been made under "night" by PC. PC aware. PLEASE NOTE: All timestamps contained within this report are represented as [place] Standard Time. CONFIDENTIALTY NOTICE: This fax transmission is intended only for the addressee. It contains information that is legally privileged, confidential or otherwise protected from use or disclosure. If you are not the intended recipient, you are strictly prohibited from reviewing, disclosing, copying using or disseminating any of this information or taking any action in reliance on or regarding this information. If you have received this fax in error, please notify us immediately by telephone so that we can arrange for its return to [place]. Phone: [phone], Toll-Free: [phone], Fax: [phone] Page: 2 of 2 Call Id: [id number]

## 2016-09-03 ENCOUNTER — Other Ambulatory Visit: Payer: Self-pay | Admitting: Family Medicine

## 2016-09-10 ENCOUNTER — Other Ambulatory Visit: Payer: Self-pay | Admitting: Family Medicine

## 2016-09-13 ENCOUNTER — Other Ambulatory Visit: Payer: Self-pay | Admitting: Family Medicine

## 2016-10-07 ENCOUNTER — Other Ambulatory Visit: Payer: Self-pay | Admitting: Family Medicine

## 2016-10-11 ENCOUNTER — Other Ambulatory Visit: Payer: Self-pay | Admitting: Family Medicine

## 2016-10-12 NOTE — Telephone Encounter (Signed)
Last filled 09-13-16 #60 Last OV/CPE 08-10-16 No Future OV

## 2016-10-12 NOTE — Telephone Encounter (Signed)
Left refill on voice mail at pharmacy  

## 2016-10-25 ENCOUNTER — Other Ambulatory Visit: Payer: Self-pay | Admitting: *Deleted

## 2016-10-25 MED ORDER — BUPROPION HCL ER (XL) 300 MG PO TB24: ORAL_TABLET | ORAL | 1 refills | Status: DC

## 2016-10-25 NOTE — Telephone Encounter (Signed)
Received fax from [facility] requesting 90 day supply.

## 2016-12-13 ENCOUNTER — Other Ambulatory Visit: Payer: Self-pay | Admitting: Family Medicine

## 2016-12-14 NOTE — Telephone Encounter (Signed)
Faxed to  [id number] - [place], [place] - [address]  [address] [place] [address]  Phone: [phone] Fax: [phone]    

## 2016-12-14 NOTE — Telephone Encounter (Signed)
Last refill 10/12/16 Last OV 08/10/16 Ok to refill?

## 2016-12-22 ENCOUNTER — Encounter: Payer: Self-pay | Admitting: Family Medicine

## 2016-12-22 ENCOUNTER — Telehealth: Payer: Self-pay

## 2016-12-22 ENCOUNTER — Ambulatory Visit (INDEPENDENT_AMBULATORY_CARE_PROVIDER_SITE_OTHER): Payer: BC Managed Care – PPO | Admitting: Family Medicine

## 2016-12-22 VITALS — BP 158/100 | HR 83 | Temp 98.8°F | Ht 64.0 in | Wt 216.0 lb

## 2016-12-22 DIAGNOSIS — J0110 Acute frontal sinusitis, unspecified: Secondary | ICD-10-CM | POA: Diagnosis not present

## 2016-12-22 MED ORDER — AMOXICILLIN-POT CLAVULANATE 875-125 MG PO TABS: 1.0000 | ORAL_TABLET | Freq: Two times a day (BID) | ORAL | 0 refills | Status: AC

## 2016-12-22 MED ORDER — HYDROCODONE-HOMATROPINE 5-1.5 MG/5ML PO SYRP: 5.0000 mL | ORAL_SOLUTION | Freq: Three times a day (TID) | ORAL | 0 refills | Status: DC | PRN

## 2016-12-22 MED ORDER — FLUCONAZOLE 150 MG PO TABS: 150.0000 mg | ORAL_TABLET | Freq: Once | ORAL | 0 refills | Status: AC

## 2016-12-22 NOTE — Telephone Encounter (Signed)
Pt left v/m; pt seen earlier today and pt forgot to ask for med incase pt gets yeast infection after taking abx. [facility].

## 2016-12-22 NOTE — Progress Notes (Signed)
Pre visit review using our clinic review tool, if applicable. No additional management support is needed unless otherwise documented below in the visit note. 

## 2016-12-22 NOTE — Progress Notes (Signed)
SUBJECTIVE:  Amber Frost is a 46 y.o. female who complains of coryza, congestion, sore throat, dry cough and bilateral sinus pain for 12 days. She denies a history of anorexia and chest pain and denies a history of asthma. Patient denies smoke cigarettes.   Current Outpatient Prescriptions on File Prior to Visit  Medication Sig Dispense Refill  . ALPRAZolam (XANAX) 1 MG tablet TAKE 1 TABLET BY MOUTH THREE TIMES A DAY AS NEEDED 60 tablet 3  . buPROPion (WELLBUTRIN XL) 300 MG 24 hr tablet TAKE 1 TABLET (300 MG TOTAL) BY MOUTH DAILY. 90 tablet 1  . clobetasol cream (TEMOVATE) 8.25 % Apply 1 application topically at bedtime. 30 g 0  . lisinopril (PRINIVIL,ZESTRIL) 20 MG tablet Take 1 tablet (20 mg total) by mouth daily. 90 tablet 3  . Norethindrone-Ethinyl Estradiol-Fe Biphas (LO LOESTRIN FE) 1 MG-10 MCG / 10 MCG tablet Take 1 tablet by mouth daily. 28 tablet 12   No current facility-administered medications on file prior to visit.     Allergies  Allergen Reactions  . Sulfa Antibiotics Rash    Past Medical History:  Diagnosis Date  . Anxiety   . Depression   . Hypertension     Past Surgical History:  Procedure Laterality Date  . NASAL SINUS SURGERY    . NASAL SINUS SURGERY      Family History  Problem Relation Age of Onset  . Diabetes Other   . Hypertension Other   . Breast cancer Paternal Aunt 61    Social History   Social History  . Marital status: Married    Spouse name: N/A  . Number of children: 1  . Years of education: N/A   Occupational History  . Director of Education    Social History Main Topics  . Smoking status: Never Smoker  . Smokeless tobacco: Never Used  . Alcohol use 0.0 oz/week  . Drug use: No  . Sexual activity: Not on file   Other Topics Concern  . Not on file   Social History Narrative  . No narrative on file   The PMH, PSH, Social History, Family History, Medications, and allergies have been reviewed in [facility], and have been updated if  relevant.  OBJECTIVE: BP (!) 158/100   Pulse 83   Temp 98.8 F (37.1 C)   Ht 5\' 4"  (1.626 m)   Wt 216 lb (98 kg)   SpO2 98%   BMI 37.08 kg/m   BP Readings from Last 3 Encounters:  12/22/16 (!) 158/100  08/10/16 138/90  06/17/15 130/86    She appears well, vital signs are as noted. Ears normal.  Throat and pharynx normal.  Neck supple. No adenopathy in the neck. Nose is congested. Sinuses  tender. The chest is clear, without wheezes or rales.  ASSESSMENT:  sinusitis  PLAN: Given duration and progression of symptoms, will treat for bacterial sinusitis with Augmentin, Hycodan as needed for severe cough.  Symptomatic therapy suggested: push fluids, rest and return office visit prn if symptoms persist or worsen.Call or return to clinic prn if these symptoms worsen or fail to improve as anticipated.

## 2016-12-22 NOTE — Telephone Encounter (Signed)
eRx sent for diflucan to pharmacy on file.

## 2017-02-23 ENCOUNTER — Ambulatory Visit: Payer: BC Managed Care – PPO | Attending: Orthopedic Surgery

## 2017-02-23 VITALS — BP 148/98 | HR 83

## 2017-02-23 DIAGNOSIS — M542 Cervicalgia: Secondary | ICD-10-CM | POA: Insufficient documentation

## 2017-02-23 DIAGNOSIS — M5412 Radiculopathy, cervical region: Secondary | ICD-10-CM | POA: Diagnosis present

## 2017-02-23 DIAGNOSIS — M791 Myalgia: Secondary | ICD-10-CM | POA: Insufficient documentation

## 2017-02-23 NOTE — Therapy (Signed)
[facility] [address], [place], [address] Phone: [phone]   Fax:  [phone]  Physical Therapy Evaluation  Patient Details  Name: Amber Frost MRN: [id number] Date of Birth: Apr 25, 1971 Referring Provider: Juliane Lack, PA  Encounter Date: 02/23/2017      PT End of Session - 02/23/17 0806    Visit Number 1   Number of Visits 13   Date for PT Re-Evaluation 04/07/17   PT Start Time 0806   PT Stop Time 0907   PT Time Calculation (min) 61 min   Activity Tolerance Patient tolerated treatment well   Behavior During Therapy [facility] for tasks assessed/performed      Past Medical History:  Diagnosis Date  . Anxiety   . Depression   . Hypertension     Past Surgical History:  Procedure Laterality Date  . NASAL SINUS SURGERY    . NASAL SINUS SURGERY      Vitals:   02/23/17 0814 02/23/17 0815  BP: (!) 159/103 (!) 148/98  Pulse: 88 83         Subjective Assessment - 02/23/17 0815    Subjective Neck pain: 5/10 currently, and at best; 9/10 at  worst   Pertinent History Cervical radiculopathy. 4 years ago, pt started having intense R neck pain. Now has pain in both sides. Pain increased November 2017 in which pain went down her R UE. Had x-ray for her neck, which revealed arthritis. Did not do PT. Now has pain in both sides of her neck.  States feeling blurred vision and dizziness but does not feel like it is related to her neck pain. Has allergies and sinus problems.  Feels weakness bilateral UE but does not prevent her from lifting things. Makes it more difficult. For the past 6 months, pain localized more to her neck and upper back and less in her bilateral UE (around the C5/C6 dermatome).  Has not yet had PT for her neck.   Patient Stated Goals Feel better, be able to focus (such as when sitting in a meeting), improve quality of life    Currently in Pain? Yes   Pain Score 5    Pain Location Neck    Pain Orientation Left;Right   Pain Descriptors / Indicators Radiating;Aching;Dull;Throbbing   Pain Type Chronic pain   Pain Radiating Towards bilateral UE around the C5/C6 dermatome   Pain Onset More than a month ago   Pain Frequency Constant   Aggravating Factors  sitting for 45 min or more (such as in a meeting, sits a lot at work, 90 min before getting up), driving, sleeping (pt sleeps in her side and stomach, wakes up in a lot of pain), stationary positions   Pain Relieving Factors movement, stretching her neck, anti-inflamatory medication, therapeutic massage.             [facility] PT Assessment - 02/23/17 0830      Assessment   Medical Diagnosis Cervical radiculopathy, DDD   Referring Provider Juliane Lack, PA   Onset Date/Surgical Date 02/10/17  Date of PT referral. Chronic condition.   Hand Dominance Right   Prior Therapy none     Precautions   Precaution Comments blood pressure     Balance Screen   Has the patient fallen in the past 6 months No   Has the patient had a decrease in activity level because of a fear of falling?  No   Is the patient  reluctant to leave their home because of a fear of falling?  No     Home Environment   Additional Comments Pt lives in a 2 story home with family. 15 steps, R rail      Prior Function   Vocation Full time employment  Asst. Supt   Vocation Requirements PLOF: better able to tolerate prolonged sitting, better able to drive and sleep with less neck pain   Leisure travel     Observation/Other Assessments   Observations No change with cervical compression or distraction; (+) median nerve tension L UE (decreased with R and L cervical side bending, increases in neutral position), (+) median nerve tension R (decreases with L cervical side bend)   Neck Disability Index  46%     Posture/Postural Control   Posture Comments Bilaterally protracted shoulders and neck, slight L cervical side bend, movement preference around C6-C7,  and around C4/C5     AROM   Overall AROM Comments Cervical flexion and L side bending reproduces symptoms. WFL bilateral shoulder flexion but L UE slightly limited   Cervical Flexion 29  reproduced L lateral neck pain, eases with rest   Cervical Extension 69   Cervical - Right Side Bend 23   Cervical - Left Side Bend 18  reproduced L neck pain, eases with rest   Cervical - Right Rotation 55  feels stiff   Cervical - Left Rotation 50  feels stiff     Strength   Right Shoulder Flexion 5/5   Right Shoulder ABduction 5/5   Left Shoulder Flexion 5/5   Left Shoulder ABduction 5/5   Right Elbow Flexion 5/5   Right Elbow Extension 5/5   Left Elbow Flexion 5/5   Left Elbow Extension 5/5   Right Wrist Extension 5/5   Left Wrist Extension 4+/5     Palpation   Palpation comment C6 more prominant;  decreased first rib mobility R > L, scalene muscle tension R > L. Upper trap and rhomboid muslce tension R > L            Objective measurements completed on examination: See above findings.   BP L arm sitting, mechanically taken, 159/103, HR 88; then 149/98, HR 83 Manual therapy  STM R rhomboid and upper trap muscle area to decrease tension  Neck pain decreased to 4/10 afterwards   2x/week for 4 weeks     Demonstrates bilateral upper trap, scalene muscle tension, difficulty donning and doffing coats            PT Education - 02/23/17 2100    Education provided Yes   Education Details plan of care   Person(s) Educated Patient   Methods Explanation   Comprehension Verbalized understanding             PT Long Term Goals - 02/23/17 2041      PT LONG TERM GOAL #1   Title Patient will have a decrease in neck pain to 4/10 or less at worst to promote ability to tolerate prolonged sitting at work, improve ability to sleep, drive.   Baseline 9/10 neck pain at worst (02/23/2017)   Time 6   Period Weeks   Status New   Target Date 04/07/17     PT LONG TERM GOAL #2    Title Patient will improve her Neck Disability Index Score by at least 12% as a demonstration of improved function.    Baseline 46% (02/23/2017)   Time 6   Period Weeks  Status New   Target Date 04/07/17     PT LONG TERM GOAL #3   Title Pt will report being able to sit about 60 minutes with decreased overall neck pain to promote ability to focus during meetings.    Baseline Sitting 45 minutes increases neck pain (02/23/2017)   Time 6   Period Weeks   Status New   Target Date 04/07/17                Plan - 02/23/17 1254    Clinical Impression Statement Patient is a 46 year old female who came to physcial therapy secondary to neck pain with radiating symptoms. She also presents with poor posture, reproduction of neck pain with cervical flexion and L side bending; neural tension, bilateral upper trap and scalene muscle tension and difficulty performing functional tasks such as donning and doffing sweaters, and tolerating prolonged positions such as sitting. Patient will benefit from skilled physical therapy services to address the aforementioned deficits.    History and Personal Factors relevant to plan of care: Chronicity of condition, work stress, difficulty tolerating prolonged sitting   Clinical Presentation Stable   Clinical Presentation due to: decreased pain after session   Clinical Decision Making Low   Rehab Potential Good   Clinical Impairments Affecting Rehab Potential Chronicity of condition   PT Frequency 2x / week   PT Duration 6 weeks   PT Treatment/Interventions Therapeutic activities;Therapeutic exercise;Manual techniques;Dry needling;Patient/family education;Neuromuscular re-education;Ultrasound;Electrical Stimulation;Iontophoresis 4mg /ml Dexamethasone;Aquatic Therapy   PT Next Visit Plan manual therapy, scapular strengthening, modalities PRN   Consulted and Agree with Plan of Care Patient      Patient will benefit from skilled therapeutic intervention in  order to improve the following deficits and impairments:  Pain, Decreased range of motion  Visit Diagnosis: Cervicalgia - Plan: PT plan of care cert/re-cert  Radiculopathy, cervical region - Plan: PT plan of care cert/re-cert     Problem List Patient Active Problem List   Diagnosis Date Noted  . Morbid obesity ([place]) 06/17/2015  . GERD (gastroesophageal reflux disease) 05/25/2013  . Menorrhagia 05/09/2012  . LICHEN PLANUS 28/78/6767  . Anxiety state 08/06/2009  . Depression with anxiety 08/06/2009  . Essential hypertension 08/06/2009    Joneen Boers PT, DPT   02/23/2017, 9:06 PM  [facility] [address], [place], [address] Phone: [phone]   Fax:  [phone]  Name: TRANISHA TISSUE MRN: [id number] Date of Birth: [birth year]

## 2017-02-23 NOTE — Patient Instructions (Signed)
Bilatearl scapular retraction and depression throughout the day.

## 2017-03-01 ENCOUNTER — Telehealth: Payer: Self-pay

## 2017-03-01 ENCOUNTER — Ambulatory Visit: Payer: BC Managed Care – PPO

## 2017-03-01 NOTE — Telephone Encounter (Signed)
Okay to stop Lisinopril but she will need to come in to discuss starting another blood pressure medication.   Can she be seen this week?

## 2017-03-01 NOTE — Telephone Encounter (Signed)
Amber Frost with Dr Reggy Eye office left Amber Frost; Dr Tami Ribas saw pt 02/10/17 and a note was sent to Dr Deborra Medina about stopping lisinopril and changing to different med and Amber Frost does not think Dr Deborra Medina got the note. Dr Tami Ribas would like pt to stop lisinopril. Pt having allergic symptoms to lisinopril. Amber Frost request cb so she can notify pt. Sounded like pt is going to start shots on 03/07/17. Amber Frost request cb.

## 2017-03-02 NOTE — Telephone Encounter (Signed)
Left detailed message to [place].

## 2017-03-07 ENCOUNTER — Encounter: Payer: Self-pay | Admitting: Family Medicine

## 2017-03-07 ENCOUNTER — Ambulatory Visit (INDEPENDENT_AMBULATORY_CARE_PROVIDER_SITE_OTHER): Payer: BC Managed Care – PPO | Admitting: Family Medicine

## 2017-03-07 VITALS — BP 130/94 | HR 97 | Temp 98.5°F | Resp 16 | Wt 217.5 lb

## 2017-03-07 DIAGNOSIS — I10 Essential (primary) hypertension: Secondary | ICD-10-CM

## 2017-03-07 DIAGNOSIS — F4322 Adjustment disorder with anxiety: Secondary | ICD-10-CM

## 2017-03-07 DIAGNOSIS — Z23 Encounter for immunization: Secondary | ICD-10-CM | POA: Diagnosis not present

## 2017-03-07 MED ORDER — HYDROCHLOROTHIAZIDE 12.5 MG PO CAPS
12.5000 mg | ORAL_CAPSULE | Freq: Every day | ORAL | 3 refills | Status: DC
Start: 2017-03-07 — End: 2017-03-29

## 2017-03-07 MED ORDER — SERTRALINE HCL 50 MG PO TABS: 50.0000 mg | ORAL_TABLET | Freq: Every day | ORAL | 3 refills | Status: DC

## 2017-03-07 NOTE — Patient Instructions (Addendum)
Great to see you.  Please STOP taking lisinopril.  Start HCTZ 12.5 mg daily.  My blood pressure cuff company is called [employer].   Either check your pressure at home and update me or come in 2 weeks.  Please let me know if your blood pressure is sustained > 150s/90s  We are starting Zoloft 50 mg. Start out with 1/2 tablet daily for 10 days, then advance to 1 full tablet thereafter. We discussed possible side effects of headache, GI upset, drowsiness, and SI/HI.

## 2017-03-07 NOTE — Assessment & Plan Note (Signed)
Deteriorated. >25 minutes spent in face to face time with patient, >50% spent in counselling or coordination of care discussing anxiety and HTN. Start Zoloft 50 mg. Patient is to take 1/2 tablet daily for 10 days, then advance to 1 full tablet thereafter. We discussed possible side effects of headache, GI upset, drowsiness, and SI/HI. If thoughts of SI/HI develop, we discussed to present to the emergency immediately. Patient verbalized understanding.   Refer for psychotherapy.

## 2017-03-07 NOTE — Progress Notes (Signed)
Subjective:   Patient ID: Amber Frost, female    DOB: 11/09/1970, 46 y.o.   MRN: [id number]  Amber Frost is a pleasant 46 y.o. year old female who presents to clinic today with Medication Problem  on 03/07/2017  HPI:  HTN-  Saw Dr. Tami Ribas who advised that she needs a different antihypertensive.  She has had no adverse rxn to it but he feels it is best for her to be on something else given her recurrent allergic rhinitis/bronchitis.  Anxiety has deteriorated- work stressors at are "at an all time high."  She is taking xanax once daily now.  Hard time falling asleep.  Waking up in a "cold sweat, " anxious about work.  Does feel sad about her work situation.  Denies any SI or HI. Has noticed some decreased concentration. She is taking wellbutrin as well. Has taken pristiq in the past but felt "she was not herself." Current Outpatient Prescriptions on File Prior to Visit  Medication Sig Dispense Refill  . ALPRAZolam (XANAX) 1 MG tablet TAKE 1 TABLET BY MOUTH THREE TIMES A DAY AS NEEDED 60 tablet 3  . buPROPion (WELLBUTRIN XL) 300 MG 24 hr tablet TAKE 1 TABLET (300 MG TOTAL) BY MOUTH DAILY. 90 tablet 1  . clobetasol cream (TEMOVATE) 7.58 % Apply 1 application topically at bedtime. 30 g 0  . Fluticasone Propionate (FLONASE NA) Place into the nose.    . MELOXICAM PO Take by mouth.    . Norethindrone-Ethinyl Estradiol-Fe Biphas (LO LOESTRIN FE) 1 MG-10 MCG / 10 MCG tablet Take 1 tablet by mouth daily. 28 tablet 12   No current facility-administered medications on file prior to visit.     Allergies  Allergen Reactions  . Sulfa Antibiotics Rash    Past Medical History:  Diagnosis Date  . Anxiety   . Depression   . Hypertension     Past Surgical History:  Procedure Laterality Date  . NASAL SINUS SURGERY    . NASAL SINUS SURGERY      Family History  Problem Relation Age of Onset  . Diabetes Other   . Hypertension Other   . Breast cancer Paternal Aunt 64    Social History    Social History  . Marital status: Married    Spouse name: N/A  . Number of children: 1  . Years of education: N/A   Occupational History  . Director of Education    Social History Main Topics  . Smoking status: Never Smoker  . Smokeless tobacco: Never Used  . Alcohol use 0.0 oz/week  . Drug use: No  . Sexual activity: Not on file   Other Topics Concern  . Not on file   Social History Narrative  . No narrative on file   The PMH, PSH, Social History, Family History, Medications, and allergies have been reviewed in [facility], and have been updated if relevant.   Review of Systems  Psychiatric/Behavioral: Positive for decreased concentration, dysphoric mood and sleep disturbance. Negative for agitation, behavioral problems, confusion, hallucinations, self-injury and suicidal ideas. The patient is nervous/anxious. The patient is not hyperactive.   All other systems reviewed and are negative.      Objective:    BP (!) 130/94 (BP Location: Left Arm, Patient Position: Sitting, Cuff Size: Large)   Pulse 97   Temp 98.5 F (36.9 C) (Oral)   Resp 16   Wt 217 lb 8 oz (98.7 kg)   SpO2 96%   BMI 37.33 kg/m  Physical Exam   General:  Well-developed,well-nourished,in no acute distress; alert,appropriate and cooperative throughout examination Head:  normocephalic and atraumatic.   Eyes:  vision grossly intact, PERRL Ears:  R ear normal and L ear normal externally, TMs clear bilaterally Nose:  no external deformity.   Mouth:  good dentition.   Neck:  No deformities, masses, or tenderness noted. Lungs:  Normal respiratory effort, chest expands symmetrically. Lungs are clear to auscultation, no crackles or wheezes. Heart:  Normal rate and regular rhythm. S1 and S2 normal without gallop, murmur, click, rub or other extra sounds. Msk:  No deformity or scoliosis noted of thoracic or lumbar spine.   Extremities:  No clubbing, cyanosis, edema, or deformity noted with normal full range  of motion of all joints.   Neurologic:  alert & oriented X3 and gait normal.   Skin:  Intact without suspicious lesions or rashes Psych:  Cognition and judgment appear intact. Alert and cooperative with normal attention span and concentration. No apparent delusions, illusions, hallucinations       Assessment & Plan:   Essential hypertension  Adjustment disorder with anxiety - Plan: Ambulatory referral to Psychology  Need for immunization against influenza - Plan: Flu Vaccine QUAD 46+ mos IM No Follow-up on file.

## 2017-03-07 NOTE — Assessment & Plan Note (Signed)
D/c lisinopril. Start HCTZ 12.5 mg daily. She will check BP at home and call me or email me with readings, advised immediately following up with me in sustained readings > 150s/90s.

## 2017-03-08 ENCOUNTER — Ambulatory Visit: Payer: BC Managed Care – PPO

## 2017-03-10 ENCOUNTER — Ambulatory Visit: Payer: BC Managed Care – PPO | Attending: Orthopedic Surgery

## 2017-03-10 DIAGNOSIS — M542 Cervicalgia: Secondary | ICD-10-CM | POA: Diagnosis not present

## 2017-03-10 DIAGNOSIS — M5412 Radiculopathy, cervical region: Secondary | ICD-10-CM | POA: Insufficient documentation

## 2017-03-10 NOTE — Therapy (Signed)
[place] [facility] [address] [place], [address] Phone: [phone]   Fax:  [phone]  Physical Therapy Treatment  Patient Details  Name: Amber Frost MRN: [id number] Date of Birth: 1970/08/02 Referring Provider: Juliane Lack, PA  Encounter Date: 03/10/2017      PT End of Session - 03/10/17 1721    Visit Number 2   Number of Visits 13   Date for PT Re-Evaluation 04/07/17   PT Start Time 5732   PT Stop Time 1753   PT Time Calculation (min) 32 min   Activity Tolerance Patient tolerated treatment well   Behavior During Therapy [facility] for tasks assessed/performed      Past Medical History:  Diagnosis Date  . Anxiety   . Depression   . Hypertension     Past Surgical History:  Procedure Laterality Date  . NASAL SINUS SURGERY    . NASAL SINUS SURGERY      There were no vitals filed for this visit.      Subjective Assessment - 03/10/17 1725    Subjective Pt changed blood pressure medication Monday 03/07/2017. Was told that she might see fluctuations in her levels. Pain is mostly at her L shoulder blade area (L rhomboid/upper trap muscle area).  3/10 currently.  Neck felt great after last session.  Also out of town next Wednesday until Saturday. 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    Pertinent History Cervical radiculopathy. 4 years ago, pt started having intense R neck pain. Now has pain in both sides. Pain increased November 2017 in which pain went down her R UE. Had x-ray for her neck, which revealed arthritis. Did not do PT. Now has pain in both sides of her neck.  States feeling blurred vision and dizziness but does not feel like it is related to her neck pain. Has allergies and sinus problems.  Feels weakness bilateral UE but does not prevent her from lifting things. Makes it more difficult. For the past 6 months, pain localized more to her neck and upper back and less in her bilateral  UE (around the C5/C6 dermatome).  Has not yet had PT for her neck.   Patient Stated Goals Feel better, be able to focus (such as when sitting in a meeting), improve quality of life    Currently in Pain? Yes   Pain Score 3    Pain Onset More than a month ago                                 PT Education - 03/10/17 1800    Education provided Yes   Education Details manual therapy to decrease muscle tension, positioning in sitting, blood pressure.    Person(s) Educated Patient   Methods Explanation;Demonstration   Comprehension Verbalized understanding        Objective  BP L arm sitting, mechanically taken,  164/111, HR 93, 172/114, HR 93. No blurred vision, dizziness, headache, asymptomatic  Pt was recommended to not use arm rest whenever sitting secondary to it causing shoulder shrug posture,  as well as supporting her head, neck and upper thoracic spine with a pillow when flying in a plane to help decrease neck muscle tension. Pt verbalized understanding. Pt also demonstrates good desk height and computer height at work based on description.   Manual therapy  STM R and L rhomboid  and upper trap muscle area to decrease tension  Decreased L rhomboid/upper trap pain after manual therapy.      Light session today consisting of manual therapy and education on positioning to help decrease muscle tension around her neck. No exercises performed secondary to elevated blood pressure levels. Decreased pain after gentle STM to decrease muscle tension at her rhomboid and upper trap muscle area.         PT Long Term Goals - 02/23/17 2041      PT LONG TERM GOAL #1   Title Patient will have a decrease in neck pain to 4/10 or less at worst to promote ability to tolerate prolonged sitting at work, improve ability to sleep, drive.   Baseline 9/10 neck pain at worst (02/23/2017)   Time 6   Period Weeks   Status New   Target Date 04/07/17     PT LONG TERM GOAL #2    Title Patient will improve her Neck Disability Index Score by at least 12% as a demonstration of improved function.    Baseline 46% (02/23/2017)   Time 6   Period Weeks   Status New   Target Date 04/07/17     PT LONG TERM GOAL #3   Title Pt will report being able to sit about 60 minutes with decreased overall neck pain to promote ability to focus during meetings.    Baseline Sitting 45 minutes increases neck pain (02/23/2017)   Time 6   Period Weeks   Status New   Target Date 04/07/17               Plan - 03/10/17 1800    Clinical Impression Statement Light session today consisting of manual therapy and education on positioning to help decrease muscle tension around her neck. No exercises performed secondary to elevated blood pressure levels. Decreased pain after gentle STM to decrease muscle tension at her rhomboid and upper trap muscle area.    History and Personal Factors relevant to plan of care: Chronicity of condition, work stress, difficulty tolerating prolonged sitting, elevated blood pressure levels   Clinical Presentation Stable   Clinical Presentation due to: decreased pain after session   Clinical Decision Making Low   Rehab Potential Good   Clinical Impairments Affecting Rehab Potential Chronicity of condition   PT Frequency 2x / week   PT Duration 6 weeks   PT Treatment/Interventions Therapeutic activities;Therapeutic exercise;Manual techniques;Dry needling;Patient/family education;Neuromuscular re-education;Ultrasound;Electrical Stimulation;Iontophoresis 4mg /ml Dexamethasone;Aquatic Therapy   PT Next Visit Plan manual therapy, scapular strengthening, modalities PRN   Consulted and Agree with Plan of Care Patient      Patient will benefit from skilled therapeutic intervention in order to improve the following deficits and impairments:  Pain, Decreased range of motion  Visit Diagnosis: Cervicalgia  Radiculopathy, cervical region     Problem List Patient  Active Problem List   Diagnosis Date Noted  . Adjustment disorder with anxiety 03/07/2017  . Morbid obesity ([place]) 06/17/2015  . GERD (gastroesophageal reflux disease) 05/25/2013  . Menorrhagia 05/09/2012  . LICHEN PLANUS 16/03/9603  . Anxiety state 08/06/2009  . Depression with anxiety 08/06/2009  . Essential hypertension 08/06/2009    Joneen Boers PT, DPT   03/10/2017, 6:42 PM  [place] [facility] [address] [place], [address] Phone: [phone]   Fax:  [phone]  Name: Amber Frost MRN: [id number] Date of Birth: September 21, 1970

## 2017-03-15 ENCOUNTER — Ambulatory Visit: Payer: BC Managed Care – PPO

## 2017-03-22 ENCOUNTER — Ambulatory Visit: Payer: BC Managed Care – PPO

## 2017-03-24 ENCOUNTER — Ambulatory Visit: Payer: BC Managed Care – PPO

## 2017-03-24 DIAGNOSIS — M542 Cervicalgia: Secondary | ICD-10-CM

## 2017-03-24 DIAGNOSIS — M5412 Radiculopathy, cervical region: Secondary | ICD-10-CM

## 2017-03-24 NOTE — Therapy (Signed)
[place] [facility] [address] [place], [address] Phone: [phone]   Fax:  [phone]  Physical Therapy Treatment  Patient Details  Name: Amber Frost MRN: [id number] Date of Birth: [birth year] Referring Provider: Juliane Lack, PA  Encounter Date: 03/24/2017      PT End of Session - 03/24/17 1546    Visit Number 3   Number of Visits 13   Date for PT Re-Evaluation 04/07/17   PT Start Time 7619   PT Stop Time 1609   PT Time Calculation (min) 23 min   Activity Tolerance Patient tolerated treatment well   Behavior During Therapy [facility] for tasks assessed/performed      Past Medical History:  Diagnosis Date  . Anxiety   . Depression   . Hypertension     Past Surgical History:  Procedure Laterality Date  . NASAL SINUS SURGERY    . NASAL SINUS SURGERY      There were no vitals filed for this visit.      Subjective Assessment - 03/24/17 1547    Subjective Neck is some better. Has good days and bad days. 1/10 currently.    Pertinent History Cervical radiculopathy. 4 years ago, pt started having intense R neck pain. Now has pain in both sides. Pain increased November 2017 in which pain went down her R UE. Had x-ray for her neck, which revealed arthritis. Did not do PT. Now has pain in both sides of her neck.  States feeling blurred vision and dizziness but does not feel like it is related to her neck pain. Has allergies and sinus problems.  Feels weakness bilateral UE but does not prevent her from lifting things. Makes it more difficult. For the past 6 months, pain localized more to her neck and upper back and less in her bilateral UE (around the C5/C6 dermatome).  Has not yet had PT for her neck.   Patient Stated Goals Feel better, be able to focus (such as when sitting in a meeting), improve quality of life    Currently in Pain? Yes   Pain Score 1    Pain Onset More than a month ago                                  PT Education - 03/24/17 1613    Education provided Yes   Education Details not resting her arms at the arm rest to help keep her shoulders from shrugging.    Person(s) Educated Patient   Methods Explanation;Demonstration   Comprehension Verbalized understanding        Objective  BP L arm sitting, mechanically taken, 184/101, HR 84  No blurred vision, dizziness, headache, asymptomatic    Manual therapy  STM R and L rhomboid and upper trap muscle area to decrease tension in sitting.    Unable to perform exercises secondary to blood pressure level not appropriate for exercises.    No pain after session. Pt was recommended to contact her doctor about her blood pressure. Pt verbalized understanding. Decreased bilateral rhomboid and upper trap muscle tension after STM.          PT Long Term Goals - 02/23/17 2041      PT LONG TERM GOAL #1   Title Patient will have a decrease in neck pain to 4/10 or less at worst to promote ability to tolerate prolonged sitting at  work, improve ability to sleep, drive.   Baseline 9/10 neck pain at worst (02/23/2017)   Time 6   Period Weeks   Status New   Target Date 04/07/17     PT LONG TERM GOAL #2   Title Patient will improve her Neck Disability Index Score by at least 12% as a demonstration of improved function.    Baseline 46% (02/23/2017)   Time 6   Period Weeks   Status New   Target Date 04/07/17     PT LONG TERM GOAL #3   Title Pt will report being able to sit about 60 minutes with decreased overall neck pain to promote ability to focus during meetings.    Baseline Sitting 45 minutes increases neck pain (02/23/2017)   Time 6   Period Weeks   Status New   Target Date 04/07/17               Plan - 03/24/17 1549    Clinical Impression Statement No pain after session. Pt was recommended to contact her doctor about her blood pressure. Pt verbalized  understanding. Decreased bilateral rhomboid and upper trap muscle tension after STM.    History and Personal Factors relevant to plan of care: Chronicity of condition, work stress, difficulty tolerating prolonged sitting, elevated blood pressure levels   Clinical Presentation Stable   Clinical Presentation due to: No neck pain after session.    Clinical Decision Making Low   Rehab Potential Good   Clinical Impairments Affecting Rehab Potential Chronicity of condition   PT Frequency 2x / week   PT Duration 6 weeks   PT Treatment/Interventions Therapeutic activities;Therapeutic exercise;Manual techniques;Dry needling;Patient/family education;Neuromuscular re-education;Ultrasound;Electrical Stimulation;Iontophoresis 4mg /ml Dexamethasone;Aquatic Therapy   PT Next Visit Plan manual therapy, scapular strengthening, modalities PRN   Consulted and Agree with Plan of Care Patient      Patient will benefit from skilled therapeutic intervention in order to improve the following deficits and impairments:  Pain, Decreased range of motion  Visit Diagnosis: Cervicalgia  Radiculopathy, cervical region     Problem List Patient Active Problem List   Diagnosis Date Noted  . Adjustment disorder with anxiety 03/07/2017  . Morbid obesity ([place]) 06/17/2015  . GERD (gastroesophageal reflux disease) 05/25/2013  . Menorrhagia 05/09/2012  . LICHEN PLANUS 29/47/6546  . Anxiety state 08/06/2009  . Depression with anxiety 08/06/2009  . Essential hypertension 08/06/2009    Joneen Boers PT, DPT   03/24/2017, 4:15 PM  [place] [facility] [address] [place], [address] Phone: [phone]   Fax:  [phone]  Name: ARION MORGAN MRN: [id number] Date of Birth: 1971/02/12

## 2017-03-24 NOTE — Patient Instructions (Signed)
Pt was recommended to no rest her arms on the chair arm rests when sitting to help keep her shoulders from shrugging up to help decrease bilateral upper trap muscle tension. Pt verbalized understanding.

## 2017-03-28 ENCOUNTER — Ambulatory Visit: Payer: Self-pay | Admitting: *Deleted

## 2017-03-28 ENCOUNTER — Telehealth: Payer: Self-pay | Admitting: *Deleted

## 2017-03-28 NOTE — Telephone Encounter (Signed)
Patient called to report her b/p continues to be elevated on the new med, hctz 12.5mg . She has stated that she should have called last week and that her systolic reading today is 185.  Please contact pt to advise for increase in dosage if is what physician wants to do.

## 2017-03-29 ENCOUNTER — Ambulatory Visit: Payer: BC Managed Care – PPO

## 2017-03-29 MED ORDER — HYDROCHLOROTHIAZIDE 25 MG PO TABS: 25.0000 mg | ORAL_TABLET | Freq: Every day | ORAL | 3 refills | Status: DC

## 2017-03-29 NOTE — Telephone Encounter (Signed)
Yes please increase HCTZ to 25 mg daily- okay to take two 12.5 mg tablets together daily and I will send in new rx for 25 mg tablets.  Please continue to keep [place] updated.  How is she feeling?

## 2017-03-30 NOTE — Telephone Encounter (Signed)
Call to patient and left voice mail of physician's new instructions re hctz dosage and to call back if she has any questions.

## 2017-03-31 ENCOUNTER — Ambulatory Visit: Payer: Self-pay | Admitting: Family Medicine

## 2017-03-31 ENCOUNTER — Ambulatory Visit: Payer: BC Managed Care – PPO

## 2017-03-31 DIAGNOSIS — Z0289 Encounter for other administrative examinations: Secondary | ICD-10-CM

## 2017-04-01 ENCOUNTER — Other Ambulatory Visit: Payer: Self-pay

## 2017-04-01 MED ORDER — SERTRALINE HCL 50 MG PO TABS: 50.0000 mg | ORAL_TABLET | Freq: Every day | ORAL | 0 refills | Status: DC

## 2017-04-18 ENCOUNTER — Telehealth: Payer: Self-pay | Admitting: Family Medicine

## 2017-04-18 NOTE — Telephone Encounter (Signed)
Is she still taking HCTZ 25 mg daily?

## 2017-04-18 NOTE — Telephone Encounter (Signed)
Copied from [place]. Topic: Quick Communication - See Telephone Encounter >> Apr 18, 2017  9:42 AM Robina Ade, Helene Kelp D wrote: CRM for notification. See Telephone encounter for: 04/18/17. Patient called to let Dr. Deborra Medina know that her BP today is 161/101 and its been in the 160 the past days.

## 2017-04-18 NOTE — Telephone Encounter (Signed)
Can she come in to see me tomorrow?

## 2017-04-18 NOTE — Telephone Encounter (Signed)
LMOVM for pt to call back as we will work her in on 11.13.2018 anywhere/thx dmf

## 2017-04-18 NOTE — Telephone Encounter (Signed)
I spoke with pt and pt is taking HCTZ 25 mg one daily; pt has h/a and dizziness but attributes symptoms to sinus issue. Pt request cb with further instructions. [facility].

## 2017-04-19 ENCOUNTER — Ambulatory Visit: Payer: BC Managed Care – PPO | Admitting: Family Medicine

## 2017-04-19 ENCOUNTER — Encounter: Payer: Self-pay | Admitting: Family Medicine

## 2017-04-19 VITALS — BP 158/92 | HR 90 | Temp 98.9°F | Ht 64.0 in | Wt 219.1 lb

## 2017-04-19 DIAGNOSIS — I10 Essential (primary) hypertension: Secondary | ICD-10-CM

## 2017-04-19 MED ORDER — LOSARTAN POTASSIUM-HCTZ 50-12.5 MG PO TABS: 1.0000 | ORAL_TABLET | Freq: Every day | ORAL | 3 refills | Status: DC

## 2017-04-19 NOTE — Patient Instructions (Signed)
Great to see you. STOP taking the HCTZ.  We are starting Lostartan- HCTZ.  Please check blood pressures for me.

## 2017-04-19 NOTE — Assessment & Plan Note (Signed)
Deteriorated. D/c HCTZ. Start losartan- HCTZ. She will keep checking her BP at home and update me. The patient indicates understanding of these issues and agrees with the plan.

## 2017-04-19 NOTE — Progress Notes (Signed)
Subjective:   Patient ID: Alberteen Sam, female    DOB: [birth year], 46 y.o.   MRN: [id number]  MONTRICE MONTUORI is a pleasant 46 y.o. year old female who presents to clinic today with Hypertension (Patient is here today C/O issues with HTN.)  on 04/19/2017  HPI:  HTN- saw her on 10/1 to change her BP medication. Dr. Tami Ribas had advised she needed to d/c lisinpril due to h/o allergies/asthma and start a new rx. She is currently taking HCTZ 25 mg daily and BP seems to be worsening.    BP Readings from Last 3 Encounters:  04/19/17 (!) 168/108  03/07/17 (!) 130/94  02/23/17 (!) 148/98   Current Outpatient Medications on File Prior to Visit  Medication Sig Dispense Refill  . ALPRAZolam (XANAX) 1 MG tablet TAKE 1 TABLET BY MOUTH THREE TIMES A DAY AS NEEDED 60 tablet 3  . buPROPion (WELLBUTRIN XL) 300 MG 24 hr tablet TAKE 1 TABLET (300 MG TOTAL) BY MOUTH DAILY. 90 tablet 1  . Cetirizine HCl (ZYRTEC ALLERGY PO) Take by mouth.    . clobetasol cream (TEMOVATE) 1.28 % Apply 1 application topically at bedtime. 30 g 0  . Fluticasone Propionate (FLONASE NA) Place into the nose.    . MELOXICAM PO Take by mouth.    . Norethindrone-Ethinyl Estradiol-Fe Biphas (LO LOESTRIN FE) 1 MG-10 MCG / 10 MCG tablet Take 1 tablet by mouth daily. 28 tablet 12  . sertraline (ZOLOFT) 50 MG tablet Take 1 tablet (50 mg total) by mouth daily. 90 tablet 0   No current facility-administered medications on file prior to visit.     Allergies  Allergen Reactions  . Sulfa Antibiotics Rash    Past Medical History:  Diagnosis Date  . Anxiety   . Depression   . Hypertension     Past Surgical History:  Procedure Laterality Date  . NASAL SINUS SURGERY    . NASAL SINUS SURGERY      Family History  Problem Relation Age of Onset  . Diabetes Other   . Hypertension Other   . Breast cancer Paternal Aunt 40    Social History   Socioeconomic History  . Marital status: Married    Spouse name: Not on file  .  Number of children: 1  . Years of education: Not on file  . Highest education level: Not on file  Social Needs  . Financial resource strain: Not on file  . Food insecurity - worry: Not on file  . Food insecurity - inability: Not on file  . Transportation needs - medical: Not on file  . Transportation needs - non-medical: Not on file  Occupational History  . Occupation: Therapist, art  Tobacco Use  . Smoking status: Never Smoker  . Smokeless tobacco: Never Used  Substance and Sexual Activity  . Alcohol use: Yes    Alcohol/week: 0.0 oz  . Drug use: No  . Sexual activity: Not on file  Other Topics Concern  . Not on file  Social History Narrative  . Not on file   The PMH, PSH, Social History, Family History, Medications, and allergies have been reviewed in [facility], and have been updated if relevant.   Review of Systems  Respiratory: Negative.   Cardiovascular: Negative.   Neurological: Positive for headaches. Negative for dizziness, tremors, seizures, syncope, facial asymmetry, speech difficulty, weakness, light-headedness and numbness.  All other systems reviewed and are negative.      Objective:    BP Marland Kitchen)  168/108 (BP Location: Right Arm, Patient Position: Sitting, Cuff Size: Normal)   Pulse 90   Temp 98.9 F (37.2 C) (Oral)   Ht 5\' 4"  (1.626 m)   Wt 219 lb 1.9 oz (99.4 kg)   SpO2 97%   BMI 37.61 kg/m    Physical Exam  Constitutional: She is oriented to person, place, and time. She appears well-developed and well-nourished. No distress.  HENT:  Head: Normocephalic and atraumatic.  Eyes: Conjunctivae are normal.  Neck: Normal range of motion.  Cardiovascular: Normal rate and regular rhythm.  Pulmonary/Chest: Effort normal and breath sounds normal.  Musculoskeletal: Normal range of motion. She exhibits no edema.  Neurological: She is alert and oriented to person, place, and time. No cranial nerve deficit.  Skin: Skin is warm and dry. She is not diaphoretic.    Psychiatric: She has a normal mood and affect. Her behavior is normal. Judgment and thought content normal.  Nursing note and vitals reviewed.         Assessment & Plan:   Essential hypertension No Follow-up on file.

## 2017-05-07 ENCOUNTER — Other Ambulatory Visit: Payer: Self-pay | Admitting: Family Medicine

## 2017-05-10 ENCOUNTER — Ambulatory Visit: Payer: BC Managed Care – PPO | Admitting: Psychology

## 2017-05-10 DIAGNOSIS — F411 Generalized anxiety disorder: Secondary | ICD-10-CM

## 2017-05-10 DIAGNOSIS — F330 Major depressive disorder, recurrent, mild: Secondary | ICD-10-CM

## 2017-06-09 ENCOUNTER — Ambulatory Visit (INDEPENDENT_AMBULATORY_CARE_PROVIDER_SITE_OTHER): Payer: BC Managed Care – PPO | Admitting: Psychology

## 2017-06-09 DIAGNOSIS — F411 Generalized anxiety disorder: Secondary | ICD-10-CM | POA: Diagnosis not present

## 2017-06-09 DIAGNOSIS — F4310 Post-traumatic stress disorder, unspecified: Secondary | ICD-10-CM

## 2017-06-15 ENCOUNTER — Other Ambulatory Visit: Payer: Self-pay

## 2017-06-15 MED ORDER — ALPRAZOLAM 1 MG PO TABS: 1.0000 mg | ORAL_TABLET | Freq: Three times a day (TID) | ORAL | 3 refills | Status: DC | PRN

## 2017-06-15 NOTE — Progress Notes (Signed)
Printed Alprazolam for TA to sign with note to pharm to have pt sched appt for May/thx dmf

## 2017-06-22 ENCOUNTER — Ambulatory Visit: Payer: BC Managed Care – PPO | Admitting: Psychology

## 2017-07-05 ENCOUNTER — Other Ambulatory Visit: Payer: Self-pay | Admitting: Family Medicine

## 2017-08-18 ENCOUNTER — Other Ambulatory Visit: Payer: Self-pay | Admitting: Family Medicine

## 2017-08-28 ENCOUNTER — Other Ambulatory Visit: Payer: Self-pay | Admitting: Family Medicine

## 2017-08-29 ENCOUNTER — Ambulatory Visit: Payer: BC Managed Care – PPO | Admitting: Family Medicine

## 2017-08-29 ENCOUNTER — Encounter: Payer: Self-pay | Admitting: Family Medicine

## 2017-08-29 VITALS — BP 132/86 | HR 97 | Temp 99.0°F | Ht 64.0 in | Wt 226.8 lb

## 2017-08-29 DIAGNOSIS — J0191 Acute recurrent sinusitis, unspecified: Secondary | ICD-10-CM

## 2017-08-29 MED ORDER — FLUCONAZOLE 150 MG PO TABS: 150.0000 mg | ORAL_TABLET | Freq: Once | ORAL | 0 refills | Status: AC

## 2017-08-29 MED ORDER — AMOXICILLIN-POT CLAVULANATE 875-125 MG PO TABS: 1.0000 | ORAL_TABLET | Freq: Two times a day (BID) | ORAL | 0 refills | Status: AC

## 2017-08-29 NOTE — Patient Instructions (Signed)
Great to see you.  Please take Augmentin - 1 tablet twice daily x 10 days.

## 2017-08-29 NOTE — Progress Notes (Signed)
SUBJECTIVE:  Amber Frost is a 47 y.o. female who complains of coryza, congestion, sneezing, sore throat, dry cough and bilateral sinus pain for 8 days. She denies a history of anorexia, chest pain and chills and denies a history of asthma. Patient denies smoke cigarettes.   Current Outpatient Medications on File Prior to Visit  Medication Sig Dispense Refill  . ALPRAZolam (XANAX) 1 MG tablet Take 1 tablet (1 mg total) by mouth 3 (three) times daily as needed. 60 tablet 3  . buPROPion (WELLBUTRIN XL) 300 MG 24 hr tablet TAKE 1 TABLET BY MOUTH EVERY DAY 90 tablet 1  . Cetirizine HCl (ZYRTEC ALLERGY PO) Take by mouth.    . clobetasol cream (TEMOVATE) 8.41 % Apply 1 application topically at bedtime. 30 g 0  . Fluticasone Propionate (FLONASE NA) Place into the nose.    . MELOXICAM PO Take by mouth.    . Norethindrone-Ethinyl Estradiol-Fe Biphas (LO LOESTRIN FE) 1 MG-10 MCG / 10 MCG tablet Take 1 tablet by mouth daily. 28 tablet 12  . sertraline (ZOLOFT) 50 MG tablet TAKE 1 TABLET BY MOUTH EVERY DAY 90 tablet 1   No current facility-administered medications on file prior to visit.     Allergies  Allergen Reactions  . Sulfa Antibiotics Rash    Past Medical History:  Diagnosis Date  . Anxiety   . Depression   . Hypertension     Past Surgical History:  Procedure Laterality Date  . NASAL SINUS SURGERY    . NASAL SINUS SURGERY      Family History  Problem Relation Age of Onset  . Diabetes Other   . Hypertension Other   . Breast cancer Paternal Aunt 13    Social History   Socioeconomic History  . Marital status: Significant Other    Spouse name: Not on file  . Number of children: 1  . Years of education: Not on file  . Highest education level: Not on file  Occupational History  . Occupation: Therapist, art  Social Needs  . Financial resource strain: Not on file  . Food insecurity:    Worry: Not on file    Inability: Not on file  . Transportation needs:   Medical: Not on file    Non-medical: Not on file  Tobacco Use  . Smoking status: Never Smoker  . Smokeless tobacco: Never Used  Substance and Sexual Activity  . Alcohol use: Yes    Alcohol/week: 0.0 oz  . Drug use: No  . Sexual activity: Not on file  Lifestyle  . Physical activity:    Days per week: Not on file    Minutes per session: Not on file  . Stress: Not on file  Relationships  . Social connections:    Talks on phone: Not on file    Gets together: Not on file    Attends religious service: Not on file    Active member of club or organization: Not on file    Attends meetings of clubs or organizations: Not on file    Relationship status: Not on file  . Intimate partner violence:    Fear of current or ex partner: Not on file    Emotionally abused: Not on file    Physically abused: Not on file    Forced sexual activity: Not on file  Other Topics Concern  . Not on file  Social History Narrative  . Not on file   The PMH, PSH, Social History, Family History, Medications,  and allergies have been reviewed in [facility], and have been updated if relevant.  OBJECTIVE: BP 132/86 (BP Location: Left Arm, Patient Position: Sitting, Cuff Size: Normal)   Pulse 97   Temp 99 F (37.2 C) (Oral)   Ht 5\' 4"  (1.626 m)   Wt 226 lb 12.8 oz (102.9 kg)   SpO2 98%   BMI 38.93 kg/m   She appears well, vital signs are as noted. Ears normal.  Throat and pharynx normal.  Neck supple. No adenopathy in the neck. Nose is congested. Sinuses  tender. The chest is clear, without wheezes or rales.  ASSESSMENT:  sinusitis  PLAN: Given duration and progression of symptoms, will treat for bacterial sinusitis.  Symptomatic therapy suggested: push fluids, rest and return office visit prn if symptoms persist or worsen. Call or return to clinic prn if these symptoms worsen or fail to improve as anticipated.

## 2017-09-20 ENCOUNTER — Other Ambulatory Visit: Payer: Self-pay | Admitting: Family Medicine

## 2017-09-20 NOTE — Telephone Encounter (Signed)
Need to justify need for abx etc/thx dmf

## 2017-10-07 ENCOUNTER — Other Ambulatory Visit: Payer: Self-pay | Admitting: Family Medicine

## 2017-10-11 ENCOUNTER — Other Ambulatory Visit: Payer: Self-pay | Admitting: Family Medicine

## 2017-10-12 ENCOUNTER — Other Ambulatory Visit: Payer: Self-pay | Admitting: Family Medicine

## 2017-10-12 NOTE — Telephone Encounter (Signed)
Not on current med list/thx dmf

## 2017-11-03 ENCOUNTER — Other Ambulatory Visit: Payer: Self-pay | Admitting: Family Medicine

## 2017-11-10 ENCOUNTER — Other Ambulatory Visit: Payer: Self-pay | Admitting: Family Medicine

## 2017-11-10 MED ORDER — BUPROPION HCL ER (XL) 300 MG PO TB24: 300.0000 mg | ORAL_TABLET | Freq: Every day | ORAL | 0 refills | Status: DC

## 2017-11-15 ENCOUNTER — Ambulatory Visit (INDEPENDENT_AMBULATORY_CARE_PROVIDER_SITE_OTHER): Payer: BC Managed Care – PPO | Admitting: Family Medicine

## 2017-11-15 ENCOUNTER — Encounter: Payer: Self-pay | Admitting: Family Medicine

## 2017-11-15 ENCOUNTER — Other Ambulatory Visit (HOSPITAL_COMMUNITY)
Admission: RE | Admit: 2017-11-15 | Discharge: 2017-11-15 | Disposition: A | Discharge status: Home / Self Care | Payer: BC Managed Care – PPO | Source: Ambulatory Visit | Attending: Family Medicine | Admitting: Family Medicine

## 2017-11-15 VITALS — BP 144/86 | HR 107 | Temp 99.1°F | Ht 64.0 in | Wt 223.8 lb

## 2017-11-15 DIAGNOSIS — Z1231 Encounter for screening mammogram for malignant neoplasm of breast: Secondary | ICD-10-CM

## 2017-11-15 DIAGNOSIS — Z01411 Encounter for gynecological examination (general) (routine) with abnormal findings: Secondary | ICD-10-CM | POA: Diagnosis not present

## 2017-11-15 DIAGNOSIS — N898 Other specified noninflammatory disorders of vagina: Secondary | ICD-10-CM | POA: Insufficient documentation

## 2017-11-15 DIAGNOSIS — I10 Essential (primary) hypertension: Secondary | ICD-10-CM | POA: Diagnosis not present

## 2017-11-15 DIAGNOSIS — Z1151 Encounter for screening for human papillomavirus (HPV): Secondary | ICD-10-CM | POA: Insufficient documentation

## 2017-11-15 DIAGNOSIS — Z01419 Encounter for gynecological examination (general) (routine) without abnormal findings: Secondary | ICD-10-CM | POA: Diagnosis present

## 2017-11-15 DIAGNOSIS — N924 Excessive bleeding in the premenopausal period: Secondary | ICD-10-CM

## 2017-11-15 DIAGNOSIS — K219 Gastro-esophageal reflux disease without esophagitis: Secondary | ICD-10-CM

## 2017-11-15 DIAGNOSIS — F418 Other specified anxiety disorders: Secondary | ICD-10-CM

## 2017-11-15 DIAGNOSIS — Z1239 Encounter for other screening for malignant neoplasm of breast: Secondary | ICD-10-CM

## 2017-11-15 LAB — CBC WITH DIFFERENTIAL/PLATELET
Basophils Absolute: 0 K/uL (ref 0.0–0.1)
Basophils Relative: 0.7 % (ref 0.0–3.0)
Eosinophils Absolute: 0.1 K/uL (ref 0.0–0.7)
Eosinophils Relative: 1.2 % (ref 0.0–5.0)
HCT: 38.7 % (ref 36.0–46.0)
Hemoglobin: 13.5 g/dL (ref 12.0–15.0)
Lymphocytes Relative: 32.4 % (ref 12.0–46.0)
Lymphs Abs: 2 K/uL (ref 0.7–4.0)
MCHC: 34.8 g/dL (ref 30.0–36.0)
MCV: 99 fl (ref 78.0–100.0)
Monocytes Absolute: 0.5 K/uL (ref 0.1–1.0)
Monocytes Relative: 8.3 % (ref 3.0–12.0)
Neutro Abs: 3.6 K/uL (ref 1.4–7.7)
Neutrophils Relative %: 57.4 % (ref 43.0–77.0)
Platelets: 238 K/uL (ref 150.0–400.0)
RBC: 3.91 Mil/uL (ref 3.87–5.11)
RDW: 13 % (ref 11.5–15.5)
WBC: 6.3 K/uL (ref 4.0–10.5)

## 2017-11-15 LAB — LDL CHOLESTEROL, DIRECT: Direct LDL: 89 mg/dL

## 2017-11-15 LAB — COMPREHENSIVE METABOLIC PANEL
ALT: 17 U/L (ref 0–35)
AST: 16 U/L (ref 0–37)
Albumin: 4.4 g/dL (ref 3.5–5.2)
Alkaline Phosphatase: 52 U/L (ref 39–117)
BILIRUBIN TOTAL: 0.5 mg/dL (ref 0.2–1.2)
BUN: 10 mg/dL (ref 6–23)
CALCIUM: 9.5 mg/dL (ref 8.4–10.5)
CHLORIDE: 105 meq/L (ref 96–112)
CO2: 28 meq/L (ref 19–32)
Creatinine, Ser: 0.76 mg/dL (ref 0.40–1.20)
GFR: 86.9 mL/min (ref >60.00)
Glucose, Bld: 95 mg/dL (ref 70–99)
Potassium: 4.5 mEq/L (ref 3.5–5.1)
Sodium: 140 mEq/L (ref 135–145)
Total Protein: 7 g/dL (ref 6.0–8.3)

## 2017-11-15 LAB — LIPID PANEL
CHOLESTEROL: 182 mg/dL (ref 0–200)
HDL: 64.5 mg/dL (ref >39.00)
NONHDL: 117.02
TRIGLYCERIDES: 223 mg/dL — AB (ref 0.0–149.0)
Total CHOL/HDL Ratio: 3
VLDL: 44.6 mg/dL — ABNORMAL HIGH (ref 0.0–40.0)

## 2017-11-15 LAB — TSH: TSH: 1.07 u[IU]/mL (ref 0.35–4.50)

## 2017-11-15 MED ORDER — BUPROPION HCL ER (XL) 300 MG PO TB24: 300.0000 mg | ORAL_TABLET | Freq: Every day | ORAL | 1 refills | Status: DC

## 2017-11-15 MED ORDER — ALPRAZOLAM 1 MG PO TABS: 1.0000 mg | ORAL_TABLET | Freq: Three times a day (TID) | ORAL | 5 refills | Status: DC | PRN

## 2017-11-15 MED ORDER — NORETHIN-ETH ESTRAD-FE BIPHAS 1 MG-10 MCG / 10 MCG PO TABS: ORAL_TABLET | ORAL | 1 refills | Status: DC

## 2017-11-15 MED ORDER — CLOBETASOL PROPIONATE 0.05 % EX CREA: 1.0000 "application " | TOPICAL_CREAM | Freq: Every day | CUTANEOUS | 2 refills | Status: DC

## 2017-11-15 MED ORDER — LISINOPRIL 30 MG PO TABS: 30.0000 mg | ORAL_TABLET | Freq: Every day | ORAL | 1 refills | Status: DC

## 2017-11-15 MED ORDER — SERTRALINE HCL 50 MG PO TABS: 50.0000 mg | ORAL_TABLET | Freq: Every day | ORAL | 1 refills | Status: DC

## 2017-11-15 NOTE — Assessment & Plan Note (Signed)
Well controlled with current rxs. No changes made today. 

## 2017-11-15 NOTE — Progress Notes (Signed)
Subjective:   Patient ID: Amber Frost, female    DOB: 02-08-1971, 47 y.o.   MRN: [id number]  Amber Frost is a pleasant 47 y.o. year old female who presents to clinic today with Annual Exam (Patient is here today for a CPE with PAP.  She is also requesting for her medications to be refilled. She is not currently fasting.)  on 11/15/2017  HPI:  Health Maintenance  Topic Date Due  . PAP SMEAR  06/12/2017  . INFLUENZA VACCINE  01/05/2018  . TETANUS/TDAP  06/07/2018  . HIV Screening  Completed    Last pap smear was done by me on 06/12/14.   She would like pap smear today. No h/o abnormal pap smears but maternal grandmother died of uterine (not sure if was cervical) CA in her early 3s.  She is on OCPs- h/o menorrhagia.  It's working well for her.  Mammogram 06/18/15  HTN- controlled with current dose of lisinopril.  No CP, SOB, blurred vision, or HA.  Lab Results  Component Value Date   CREATININE 0.76 08/10/2016   Due for labs.  She is taking Wellbutrin, Zoloft and as needed xanax for anxiety and depression.  She feels the combination of zoloft and wellbutrin have been more effective.  Current Outpatient Medications on File Prior to Visit  Medication Sig Dispense Refill  . ALPRAZolam (XANAX) 1 MG tablet Take 1 tablet (1 mg total) by mouth 3 (three) times daily as needed. 60 tablet 3  . buPROPion (WELLBUTRIN XL) 300 MG 24 hr tablet Take 1 tablet (300 mg total) by mouth daily. 30 tablet 0  . Cetirizine HCl (ZYRTEC ALLERGY PO) Take by mouth.    . clobetasol cream (TEMOVATE) 2.95 % Apply 1 application topically at bedtime. 30 g 0  . Fluticasone Propionate (FLONASE NA) Place into the nose.    Marland Kitchen lisinopril (PRINIVIL,ZESTRIL) 30 MG tablet Take 1 tablet by mouth daily.    . LO LOESTRIN FE 1 MG-10 MCG / 10 MCG tablet TAKE 1 TABLET BY MOUTH DAILY. COMPLETE PHYSICAL EXAM REQUIRED WHEN REFILLS HAVE EXPIRED 28 tablet 0  . sertraline (ZOLOFT) 50 MG tablet TAKE 1 TABLET BY MOUTH EVERY  DAY 90 tablet 0   No current facility-administered medications on file prior to visit.     Allergies  Allergen Reactions  . Sulfa Antibiotics Rash    Past Medical History:  Diagnosis Date  . Anxiety   . Depression   . Hypertension     Past Surgical History:  Procedure Laterality Date  . NASAL SINUS SURGERY    . NASAL SINUS SURGERY      Family History  Problem Relation Age of Onset  . Diabetes Other   . Hypertension Other   . Breast cancer Paternal Aunt 6    Social History   Socioeconomic History  . Marital status: Not on file    Spouse name: Not on file  . Number of children: 1  . Years of education: Not on file  . Highest education level: Not on file  Occupational History  . Occupation: Therapist, art  Social Needs  . Financial resource strain: Not on file  . Food insecurity:    Worry: Not on file    Inability: Not on file  . Transportation needs:    Medical: Not on file    Non-medical: Not on file  Tobacco Use  . Smoking status: Never Smoker  . Smokeless tobacco: Never Used  Substance and Sexual Activity  .  Alcohol use: Yes    Alcohol/week: 0.0 oz  . Drug use: No  . Sexual activity: Not on file  Lifestyle  . Physical activity:    Days per week: Not on file    Minutes per session: Not on file  . Stress: Not on file  Relationships  . Social connections:    Talks on phone: Not on file    Gets together: Not on file    Attends religious service: Not on file    Active member of club or organization: Not on file    Attends meetings of clubs or organizations: Not on file    Relationship status: Not on file  . Intimate partner violence:    Fear of current or ex partner: Not on file    Emotionally abused: Not on file    Physically abused: Not on file    Forced sexual activity: Not on file  Other Topics Concern  . Not on file  Social History Narrative  . Not on file   The PMH, PSH, Social History, Family History, Medications, and allergies  have been reviewed in [facility], and have been updated if relevant.   Review of Systems  Constitutional: Negative.   HENT: Negative.   Eyes: Negative.   Respiratory: Negative.   Cardiovascular: Negative.   Gastrointestinal: Negative.   Endocrine: Negative.   Genitourinary: Negative.   Musculoskeletal: Negative.   Allergic/Immunologic: Negative.   Neurological: Negative.   Hematological: Negative.   Psychiatric/Behavioral: Negative.   All other systems reviewed and are negative.      Objective:    BP (!) 144/86 (BP Location: Right Arm, Cuff Size: Large)   Pulse (!) 107   Temp 99.1 F (37.3 C) (Oral)   Ht 5\' 4"  (1.626 m)   Wt 223 lb 12.8 oz (101.5 kg)   SpO2 97%   BMI 38.42 kg/m    Physical Exam   General:  Well-developed,well-nourished,in no acute distress; alert,appropriate and cooperative throughout examination Head:  normocephalic and atraumatic.   Eyes:  vision grossly intact, PERRL Ears:  R ear normal and L ear normal externally, TMs clear bilaterally Nose:  no external deformity.   Mouth:  good dentition.   Neck:  No deformities, masses, or tenderness noted. Breasts:  No mass, nodules, thickening, tenderness, bulging, retraction, inflamation, nipple discharge or skin changes noted.   Lungs:  Normal respiratory effort, chest expands symmetrically. Lungs are clear to auscultation, no crackles or wheezes. Heart:  Normal rate and regular rhythm. S1 and S2 normal without gallop, murmur, click, rub or other extra sounds. Abdomen:  Bowel sounds positive,abdomen soft and non-tender without masses, organomegaly or hernias noted. Rectal:  no external abnormalities.   Genitalia:  Pelvic Exam:        External: normal female genitalia without lesions or masses        Vagina: normal without lesions or masses        Cervix: normal without lesions or masses        Adnexa: normal bimanual exam without masses or fullness        Uterus: normal by palpation        Pap smear:  performed Msk:  No deformity or scoliosis noted of thoracic or lumbar spine.   Extremities:  No clubbing, cyanosis, edema, or deformity noted with normal full range of motion of all joints.   Neurologic:  alert & oriented X3 and gait normal.   Skin:  Intact without suspicious lesions or rashes Cervical Nodes:  No lymphadenopathy noted Axillary Nodes:  No palpable lymphadenopathy Psych:  Cognition and judgment appear intact. Alert and cooperative with normal attention span and concentration. No apparent delusions, illusions, hallucinations      Assessment & Plan:   Well woman exam with routine gynecological exam - Plan: Cytology - PAP  Essential hypertension - Plan: CBC with Differential/Platelet, Comprehensive metabolic panel, Lipid panel, TSH  Gastroesophageal reflux disease, esophagitis presence not specified  Depression with anxiety  Vaginal discharge - Plan: Cytology - PAP No follow-ups on file.

## 2017-11-15 NOTE — Patient Instructions (Signed)
Great to see you. I will call you with your lab results from today and you can view them online.   Please call the breast center at [phone] to schedule your mammogram.  

## 2017-11-15 NOTE — Assessment & Plan Note (Signed)
Reviewed preventive care protocols, scheduled due services, and updated immunizations Discussed nutrition, exercise, diet, and healthy lifestyle.  Orders Placed This Encounter  Procedures  . MM Digital Screening  . CBC with Differential/Platelet  . Comprehensive metabolic panel  . Lipid panel  . TSH    

## 2017-11-15 NOTE — Assessment & Plan Note (Signed)
Mildly elevated today but she was late and rushed to get here.  She plans on buying BP cuff to keep an eye on her BP at home.

## 2017-11-15 NOTE — Assessment & Plan Note (Signed)
Well controlled with OCPs.  No changes made today.

## 2017-11-17 LAB — CYTOLOGY - PAP
Bacterial vaginitis: NEGATIVE
Candida vaginitis: NEGATIVE
Diagnosis: NEGATIVE
HPV (WINDOPATH): NOT DETECTED

## 2018-01-09 ENCOUNTER — Other Ambulatory Visit: Payer: Self-pay | Admitting: Family Medicine

## 2018-01-09 DIAGNOSIS — Z1231 Encounter for screening mammogram for malignant neoplasm of breast: Secondary | ICD-10-CM

## 2018-01-10 ENCOUNTER — Ambulatory Visit
Admission: RE | Admit: 2018-01-10 | Discharge: 2018-01-10 | Disposition: A | Discharge status: Home / Self Care | Payer: BC Managed Care – PPO | Source: Ambulatory Visit | Attending: Family Medicine | Admitting: Family Medicine

## 2018-01-10 DIAGNOSIS — Z1231 Encounter for screening mammogram for malignant neoplasm of breast: Secondary | ICD-10-CM | POA: Insufficient documentation

## 2018-03-22 ENCOUNTER — Ambulatory Visit: Payer: BC Managed Care – PPO | Admitting: Family Medicine

## 2018-03-22 ENCOUNTER — Encounter: Payer: Self-pay | Admitting: Family Medicine

## 2018-03-22 VITALS — BP 148/98 | HR 85 | Temp 98.6°F | Ht 64.0 in | Wt 231.0 lb

## 2018-03-22 DIAGNOSIS — J0100 Acute maxillary sinusitis, unspecified: Secondary | ICD-10-CM | POA: Diagnosis not present

## 2018-03-22 DIAGNOSIS — G4700 Insomnia, unspecified: Secondary | ICD-10-CM | POA: Diagnosis not present

## 2018-03-22 MED ORDER — TRAZODONE HCL 50 MG PO TABS: 25.0000 mg | ORAL_TABLET | Freq: Every evening | ORAL | 3 refills | Status: DC | PRN

## 2018-03-22 MED ORDER — AMOXICILLIN-POT CLAVULANATE 875-125 MG PO TABS: 1.0000 | ORAL_TABLET | Freq: Two times a day (BID) | ORAL | 0 refills | Status: AC

## 2018-03-22 MED ORDER — FLUCONAZOLE 150 MG PO TABS: 150.0000 mg | ORAL_TABLET | Freq: Once | ORAL | 0 refills | Status: AC

## 2018-03-22 NOTE — Assessment & Plan Note (Signed)
New- has increased stressors at work and feels her current antidepressants are working well but she is not sleeping well. f recurrent insomnia is discussed. Avoidance of caffeine sources is strongly encouraged. Sleep hygiene issues are reviewed.  Start trazodone 25-50 mg nightly for insomnia. She will update me in a few weeks. The patient indicates understanding of these issues and agrees with the plan.

## 2018-03-22 NOTE — Patient Instructions (Addendum)
Take Augmentin as directed.  Drink lots of fluids.    Treat sympotmatically with Mucinex, nasal saline irrigation, and Tylenol/Ibuprofen.   Also try an antihistamine/decongestant like claritin D or zyrtec D over the counter- two times a day as needed ( have to sign for them at pharmacy).   Try over the counter nasocort-start with 2 sprays per nostril per day...and then try to taper to 1 spray per nostril once symptoms improve.   You can use warm compresses.    Call if not improving as expected in 5-7 days.   We are starting trazodone 25- 50 mg nightly at bedtime for insomnia.

## 2018-03-22 NOTE — Progress Notes (Signed)
Very pleasant 47 year old female here for-  1.  Upper respiratory infection- started with runny nose, cough, congestion, fever, body aches over a week ago.  Sinus pressure acutely worsened past 3-5 days.  Taking zyrteec and flonase.  2.  Insomnia- not sleeping well.  Wellbutrin and zoloft have helped with anxiety and depression.  But she is constantly tired because she does not sleep well. Takes as needed xanax but not for sleep.   Current Outpatient Medications on File Prior to Visit  Medication Sig Dispense Refill  . ALPRAZolam (XANAX) 1 MG tablet Take 1 tablet (1 mg total) by mouth 3 (three) times daily as needed. 60 tablet 5  . buPROPion (WELLBUTRIN XL) 300 MG 24 hr tablet Take 1 tablet (300 mg total) by mouth daily. 90 tablet 1  . Cetirizine HCl (ZYRTEC ALLERGY PO) Take by mouth.    . clobetasol cream (TEMOVATE) 0.86 % Apply 1 application topically at bedtime. 60 g 2  . Fluticasone Propionate (FLONASE NA) Place into the nose.    Marland Kitchen lisinopril (PRINIVIL,ZESTRIL) 30 MG tablet Take 1 tablet (30 mg total) by mouth daily. 90 tablet 1  . Norethindrone-Ethinyl Estradiol-Fe Biphas (LO LOESTRIN FE) 1 MG-10 MCG / 10 MCG tablet TAKE 1 TABLET BY MOUTH DAILY 3 Package 1  . sertraline (ZOLOFT) 50 MG tablet Take 1 tablet (50 mg total) by mouth daily. 90 tablet 1   No current facility-administered medications on file prior to visit.     Allergies  Allergen Reactions  . Sulfa Antibiotics Rash    Past Medical History:  Diagnosis Date  . Anxiety   . Depression   . Hypertension     Past Surgical History:  Procedure Laterality Date  . NASAL SINUS SURGERY    . NASAL SINUS SURGERY      Family History  Problem Relation Age of Onset  . Diabetes Other   . Hypertension Other   . Breast cancer Paternal Aunt 35    Social History   Socioeconomic History  . Marital status: Single    Spouse name: Not on file  . Number of children: 1  . Years of education: Not on file  . Highest education  level: Not on file  Occupational History  . Occupation: Therapist, art  Social Needs  . Financial resource strain: Not on file  . Food insecurity:    Worry: Not on file    Inability: Not on file  . Transportation needs:    Medical: Not on file    Non-medical: Not on file  Tobacco Use  . Smoking status: Never Smoker  . Smokeless tobacco: Never Used  Substance and Sexual Activity  . Alcohol use: Yes    Alcohol/week: 0.0 standard drinks  . Drug use: No  . Sexual activity: Not on file  Lifestyle  . Physical activity:    Days per week: Not on file    Minutes per session: Not on file  . Stress: Not on file  Relationships  . Social connections:    Talks on phone: Not on file    Gets together: Not on file    Attends religious service: Not on file    Active member of club or organization: Not on file    Attends meetings of clubs or organizations: Not on file    Relationship status: Not on file  . Intimate partner violence:    Fear of current or ex partner: Not on file    Emotionally abused: Not on  file    Physically abused: Not on file    Forced sexual activity: Not on file  Other Topics Concern  . Not on file  Social History Narrative  . Not on file   The PMH, PSH, Social History, Family History, Medications, and allergies have been reviewed in [facility], and have been updated if relevant. Review of Systems  Constitutional: Positive for chills, fever and malaise/fatigue.  HENT: Positive for congestion, sinus pain and sore throat. Negative for ear discharge and ear pain.   Eyes: Negative.   Respiratory: Positive for cough. Negative for hemoptysis, sputum production, shortness of breath and wheezing.   Cardiovascular: Negative.   Gastrointestinal: Negative.   Genitourinary: Negative.   Musculoskeletal: Negative.   Neurological: Negative.   Endo/Heme/Allergies: Negative.   Psychiatric/Behavioral: Negative for depression, hallucinations, memory loss, substance abuse and  suicidal ideas. The patient has insomnia. The patient is not nervous/anxious.   All other systems reviewed and are negative.     OBJECTIVE:  BP (!) 148/98   Pulse 85   Temp 98.6 F (37 C) (Oral)   Ht 5\' 4"  (1.626 m)   Wt 231 lb (104.8 kg)   SpO2 98%   BMI 39.65 kg/m   Wt Readings from Last 3 Encounters:  03/22/18 231 lb (104.8 kg)  11/15/17 223 lb 12.8 oz (101.5 kg)  08/29/17 226 lb 12.8 oz (102.9 kg)   Physical Exam  Constitutional: She is oriented to person, place, and time and well-developed, well-nourished, and in no distress. No distress.  HENT:  Head: Normocephalic and atraumatic.  Right Ear: Hearing normal. A middle ear effusion is present.  Left Ear: Hearing normal. A middle ear effusion is present.  Nose: Mucosal edema and rhinorrhea present. Right sinus exhibits maxillary sinus tenderness. Left sinus exhibits maxillary sinus tenderness.  Cardiovascular: Normal rate and regular rhythm.  Pulmonary/Chest: Effort normal and breath sounds normal.  Musculoskeletal: Normal range of motion. She exhibits no edema.  Neurological: She is alert and oriented to person, place, and time.  Skin: Skin is warm and dry. She is not diaphoretic.  Psychiatric: Mood, memory, affect and judgment normal.  Nursing note and vitals reviewed.

## 2018-03-22 NOTE — Assessment & Plan Note (Signed)
  Given duration and progression of symptoms, will treat for bacterial sinusitis with Augmentin.  Symptomatic therapy suggested: push fluids, rest and return office visit prn if symptoms persist or worsen.  Call or return to clinic prn if these symptoms worsen or fail to improve as anticipated.

## 2018-03-25 ENCOUNTER — Encounter: Payer: Self-pay | Admitting: Family Medicine

## 2018-03-28 ENCOUNTER — Other Ambulatory Visit: Payer: Self-pay | Admitting: Family Medicine

## 2018-03-28 MED ORDER — BENZONATATE 200 MG PO CAPS: 200.0000 mg | ORAL_CAPSULE | Freq: Two times a day (BID) | ORAL | 0 refills | Status: DC | PRN

## 2018-03-28 MED ORDER — HYDROCODONE-HOMATROPINE 5-1.5 MG/5ML PO SYRP: 5.0000 mL | ORAL_SOLUTION | Freq: Three times a day (TID) | ORAL | 0 refills | Status: DC | PRN

## 2018-03-28 MED ORDER — PREDNISONE 10 MG PO TABS: ORAL_TABLET | ORAL | 0 refills | Status: DC

## 2018-04-13 ENCOUNTER — Other Ambulatory Visit: Payer: Self-pay | Admitting: Family Medicine

## 2018-05-19 ENCOUNTER — Other Ambulatory Visit: Payer: Self-pay | Admitting: Family Medicine

## 2018-05-29 ENCOUNTER — Other Ambulatory Visit: Payer: Self-pay | Admitting: Family Medicine

## 2018-06-24 ENCOUNTER — Other Ambulatory Visit: Payer: Self-pay | Admitting: Family Medicine

## 2018-07-09 ENCOUNTER — Telehealth: Payer: BC Managed Care – PPO | Admitting: Family

## 2018-07-09 DIAGNOSIS — R6889 Other general symptoms and signs: Secondary | ICD-10-CM

## 2018-07-09 MED ORDER — BENZONATATE 200 MG PO CAPS: 200.0000 mg | ORAL_CAPSULE | Freq: Three times a day (TID) | ORAL | 1 refills | Status: DC | PRN

## 2018-07-09 NOTE — Progress Notes (Signed)
E visit for Flu like symptoms   We are sorry that you are not feeling well.  Here is how we plan to help! Based on what you have shared with me it looks like you may have a respiratory virus that may be influenza.  Influenza or "the flu" is   an infection caused by a respiratory virus. The flu virus is highly contagious and persons who did not receive their yearly flu vaccination may "catch" the flu from close contact.  We have anti-viral medications to treat the viruses that cause this infection. They are not a "cure" and only shorten the course of the infection. These prescriptions are most effective when they are given within the first 2 days of "flu" symptoms. Antiviral medication are indicated if you have a high risk of complications from the flu. You should  also consider an antiviral medication if you are in close contact with someone who is at risk. These medications can help patients avoid complications from the flu  but have side effects that you should know. Possible side effects from Tamiflu or oseltamivir include nausea, vomiting, diarrhea, dizziness, headaches, eye redness, sleep problems or other respiratory symptoms. You should not take Tamiflu if you have an allergy to oseltamivir or any to the ingredients in Tamiflu.  Based upon your symptoms and potential risk factors I recommend that you follow the flu symptoms recommendation that I have listed below. I have also sent in a prescription for Tessalon 200 mg three times a day as needed for cough, to your pharmacy.  ANYONE WHO HAS FLU SYMPTOMS SHOULD: . Stay home. The flu is highly contagious and going out or to work exposes others! . Be sure to drink plenty of fluids. Water is fine as well as fruit juices, sodas and electrolyte beverages. You may want to stay away from caffeine or alcohol. If you are nauseated, try taking small sips of liquids. How do you know if you are getting enough fluid? Your urine should be a pale yellow or  almost colorless. . Get rest. . Taking a steamy shower or using a humidifier may help nasal congestion and ease sore throat pain. Using a saline nasal spray works much the same way. . Cough drops, hard candies and sore throat lozenges may ease your cough. . Line up a caregiver. Have someone check on you regularly.   GET HELP RIGHT AWAY IF: . You cannot keep down liquids or your medications. . You become short of breath . Your fell like you are going to pass out or loose consciousness. . Your symptoms persist after you have completed your treatment plan MAKE SURE YOU   Understand these instructions.  Will watch your condition.  Will get help right away if you are not doing well or get worse.  Your e-visit answers were reviewed by a board certified advanced clinical practitioner to complete your personal care plan.  Depending on the condition, your plan could have included both over the counter or prescription medications.  If there is a problem please reply  once you have received a response from your provider.  Your safety is important to [place].  If you have drug allergies check your prescription carefully.    You can use MyChart to ask questions about today's visit, request a non-urgent call back, or ask for a work or school excuse for 24 hours related to this e-Visit. If it has been greater than 24 hours you will need to follow up with your provider,  or enter a new e-Visit to address those concerns.  You will get an e-mail in the next two days asking about your experience.  I hope that your e-visit has been valuable and will speed your recovery. Thank you for using e-visits.

## 2018-07-11 ENCOUNTER — Ambulatory Visit: Payer: Self-pay | Admitting: Family Medicine

## 2018-07-12 ENCOUNTER — Other Ambulatory Visit: Payer: Self-pay | Admitting: Family Medicine

## 2018-07-13 ENCOUNTER — Other Ambulatory Visit: Payer: Self-pay | Admitting: Family Medicine

## 2018-08-03 ENCOUNTER — Other Ambulatory Visit: Payer: Self-pay

## 2018-08-03 MED ORDER — TRAZODONE HCL 50 MG PO TABS: 25.0000 mg | ORAL_TABLET | Freq: Every evening | ORAL | 1 refills | Status: DC | PRN

## 2018-08-23 ENCOUNTER — Other Ambulatory Visit: Payer: Self-pay

## 2018-08-23 MED ORDER — ALPRAZOLAM 1 MG PO TABS: 1.0000 mg | ORAL_TABLET | Freq: Three times a day (TID) | ORAL | 1 refills | Status: DC | PRN

## 2018-08-23 NOTE — Telephone Encounter (Signed)
TA-Refill req received for this pt/she is due for OV in April which I stated in the note section on the Rx/Per [place] PMP pt is compliant no red flags/plz advise/thx dmf

## 2018-08-30 IMAGING — MG MM DIGITAL SCREENING BILAT W/ TOMO W/ CAD
8 series · 8 of 24 positions shown · non-contrast
Comparison: Previous exam(s).

CLINICAL DATA: Screening.

EXAM:
DIGITAL SCREENING BILATERAL MAMMOGRAM WITH TOMO AND CAD

[R CC synth-2D]
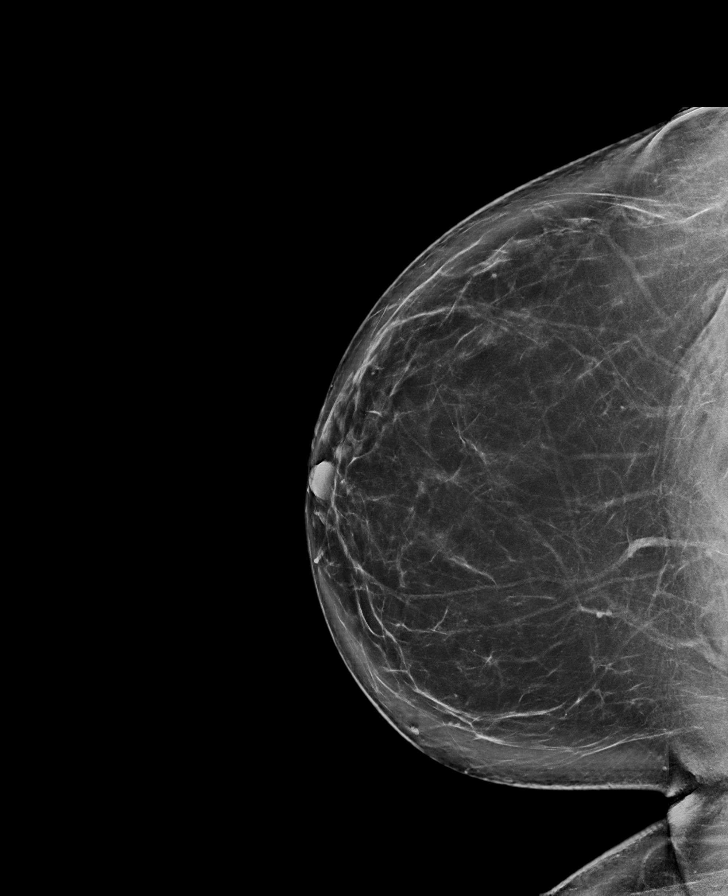

[L CC synth-2D]
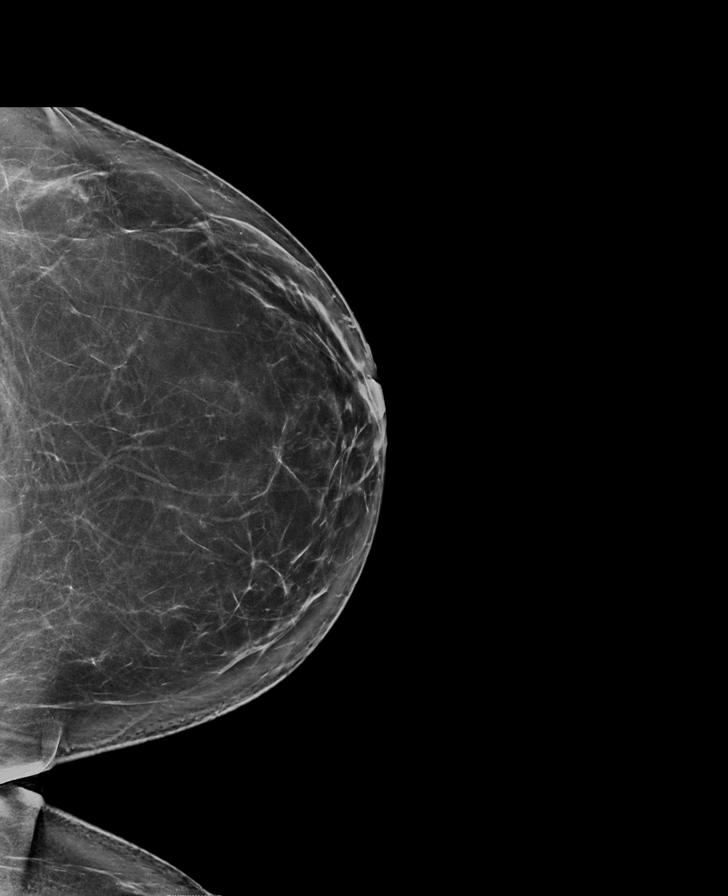

[L MLO synth-2D]
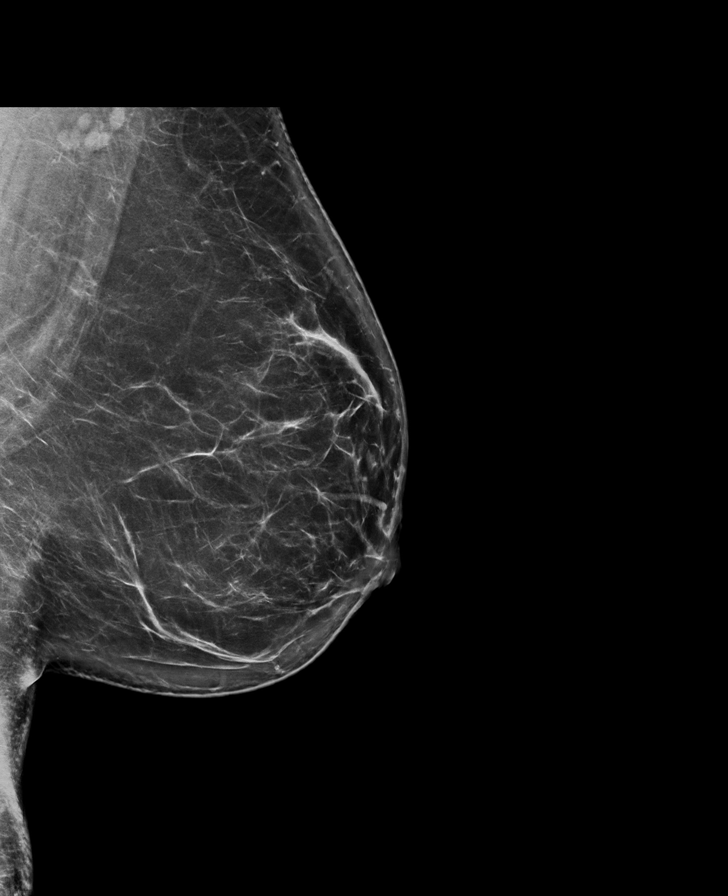

[R MLO synth-2D]
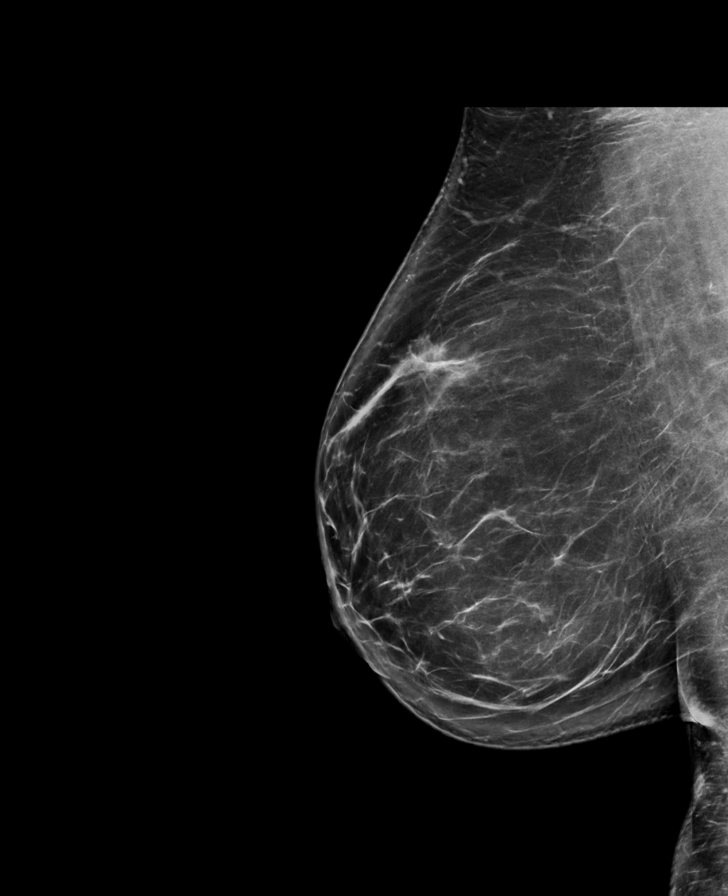

[R MLO tomo · tomo slice 50/99.0]
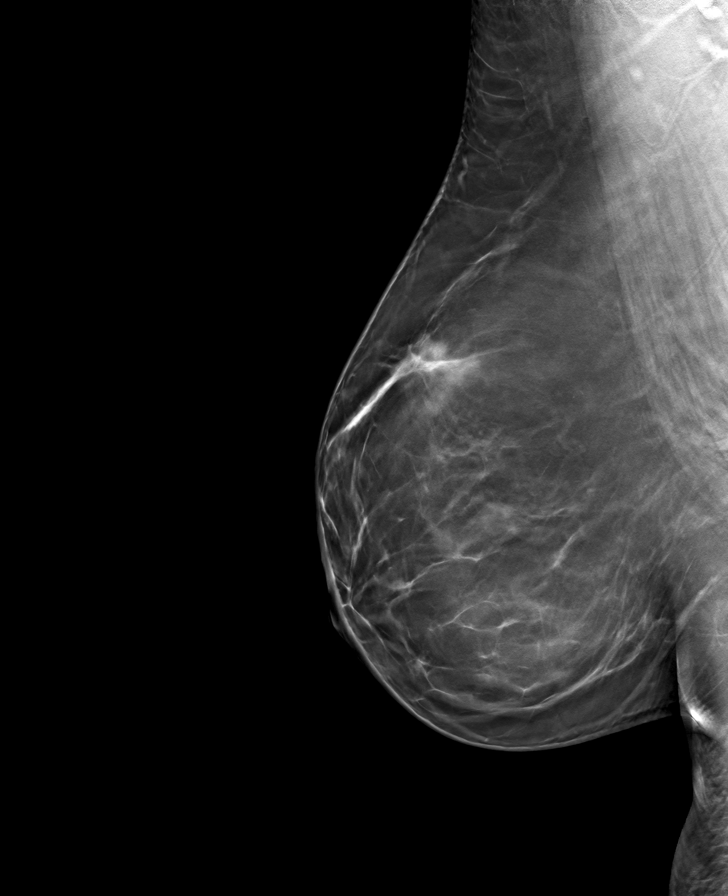

[L CC tomo · tomo slice 47/94.0]
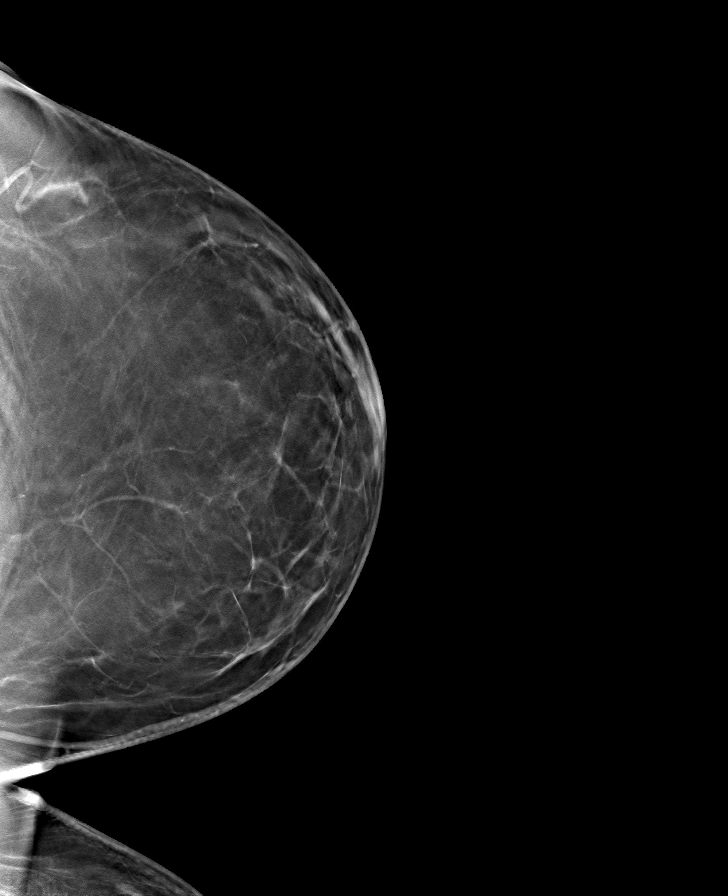

[R CC tomo · tomo slice 51/101.0]
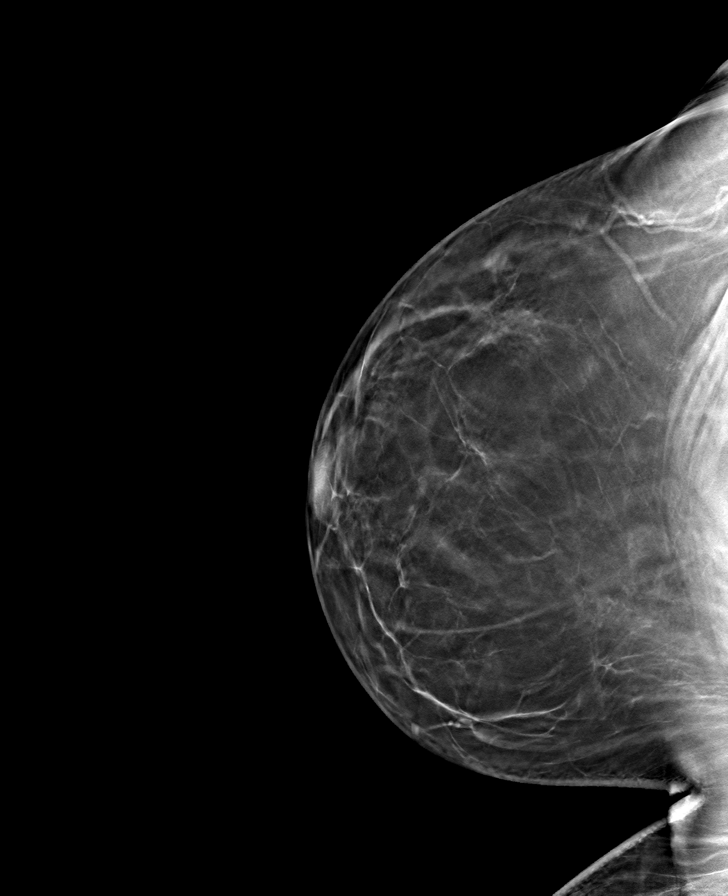

[L MLO tomo · tomo slice 49/98.0]
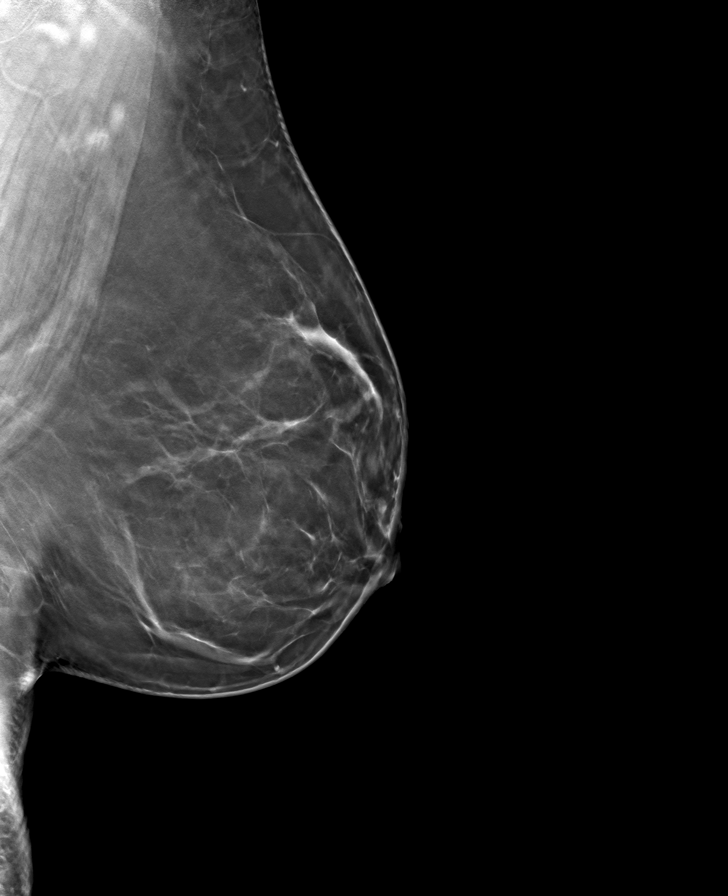

[8 of 24 positions shown; findings below may reference images not displayed]

ACR Breast Density Category b: There are scattered areas of
fibroglandular density.
FINDINGS: There are no findings suspicious for malignancy. Images were
processed with CAD.
IMPRESSION: No mammographic evidence of malignancy. A result letter of this
screening mammogram will be mailed directly to the patient.

RECOMMENDATION:
Screening mammogram in one year. (Code:[id number])

BI-RADS CATEGORY  1: Negative.

## 2018-10-13 ENCOUNTER — Other Ambulatory Visit: Payer: Self-pay | Admitting: Family Medicine

## 2018-10-13 DIAGNOSIS — F418 Other specified anxiety disorders: Secondary | ICD-10-CM

## 2018-10-13 NOTE — Telephone Encounter (Signed)
Pt requested Rx refill. Sent to Dr. Aron for review.  

## 2018-10-30 ENCOUNTER — Other Ambulatory Visit: Payer: Self-pay | Admitting: Family Medicine

## 2018-11-14 ENCOUNTER — Other Ambulatory Visit: Payer: Self-pay | Admitting: Family Medicine

## 2018-11-16 ENCOUNTER — Telehealth: Payer: Self-pay | Admitting: Family Medicine

## 2018-11-16 NOTE — Telephone Encounter (Signed)
I called pt based on message she sent in to schedule an appt:  Appointment Request From: Alberteen Sam  With Provider: Arnette Norris, MD [[facility]]  Preferred Date Range: 11/21/2018 - 11/21/2018  Preferred Times: Any Time  Reason for visit: Request an Appointment  Comments: Annual checkup  I made pt aware of certain days that Dr Deborra Medina will be in the office which is Mondays and Wednesdays and she said she will look into it and call back when she thinks she might be able to come in

## 2019-01-11 ENCOUNTER — Other Ambulatory Visit: Payer: Self-pay | Admitting: Family Medicine

## 2019-01-11 DIAGNOSIS — F418 Other specified anxiety disorders: Secondary | ICD-10-CM

## 2019-01-14 ENCOUNTER — Other Ambulatory Visit: Payer: Self-pay | Admitting: Family Medicine

## 2019-01-28 ENCOUNTER — Other Ambulatory Visit: Payer: Self-pay | Admitting: Family Medicine

## 2019-02-05 ENCOUNTER — Other Ambulatory Visit: Payer: Self-pay | Admitting: Family Medicine

## 2019-02-05 DIAGNOSIS — F418 Other specified anxiety disorders: Secondary | ICD-10-CM

## 2019-02-28 ENCOUNTER — Encounter: Payer: Self-pay | Admitting: Family Medicine

## 2019-02-28 ENCOUNTER — Other Ambulatory Visit: Payer: Self-pay

## 2019-02-28 ENCOUNTER — Encounter (INDEPENDENT_AMBULATORY_CARE_PROVIDER_SITE_OTHER): Payer: Self-pay | Admitting: Family Medicine

## 2019-02-28 ENCOUNTER — Ambulatory Visit (INDEPENDENT_AMBULATORY_CARE_PROVIDER_SITE_OTHER): Payer: BC Managed Care – PPO | Admitting: Family Medicine

## 2019-02-28 VITALS — BP 165/118 | HR 99 | Temp 98.0°F | Ht 64.0 in | Wt 226.0 lb

## 2019-02-28 DIAGNOSIS — Z9189 Other specified personal risk factors, not elsewhere classified: Secondary | ICD-10-CM

## 2019-02-28 DIAGNOSIS — Z6838 Body mass index (BMI) 38.0-38.9, adult: Secondary | ICD-10-CM

## 2019-02-28 DIAGNOSIS — R0602 Shortness of breath: Secondary | ICD-10-CM

## 2019-02-28 DIAGNOSIS — R5383 Other fatigue: Secondary | ICD-10-CM | POA: Diagnosis not present

## 2019-02-28 DIAGNOSIS — I10 Essential (primary) hypertension: Secondary | ICD-10-CM

## 2019-02-28 DIAGNOSIS — F418 Other specified anxiety disorders: Secondary | ICD-10-CM | POA: Diagnosis not present

## 2019-02-28 DIAGNOSIS — J3089 Other allergic rhinitis: Secondary | ICD-10-CM

## 2019-02-28 DIAGNOSIS — Z0289 Encounter for other administrative examinations: Secondary | ICD-10-CM

## 2019-02-28 MED ORDER — CHLORTHALIDONE 25 MG PO TABS: 25.0000 mg | ORAL_TABLET | Freq: Every day | ORAL | 0 refills | Status: DC

## 2019-03-01 ENCOUNTER — Encounter (INDEPENDENT_AMBULATORY_CARE_PROVIDER_SITE_OTHER): Payer: Self-pay | Admitting: Family Medicine

## 2019-03-01 LAB — COMPREHENSIVE METABOLIC PANEL
ALT: 23 IU/L (ref 0–32)
AST: 23 IU/L (ref 0–40)
Albumin/Globulin Ratio: 1.6 (ref 1.2–2.2)
Albumin: 4.6 g/dL (ref 3.8–4.8)
Alkaline Phosphatase: 64 IU/L (ref 39–117)
BUN/Creatinine Ratio: 16 (ref 9–23)
BUN: 12 mg/dL (ref 6–24)
Bilirubin Total: 0.4 mg/dL (ref 0.0–1.2)
CO2: 21 mmol/L (ref 20–29)
Calcium: 9 mg/dL (ref 8.7–10.2)
Chloride: 102 mmol/L (ref 96–106)
Creatinine, Ser: 0.73 mg/dL (ref 0.57–1.00)
GFR calc Af Amer: 113 mL/min/{1.73_m2} (ref >59)
GFR calc non Af Amer: 98 mL/min/{1.73_m2} (ref >59)
Globulin, Total: 2.8 g/dL (ref 1.5–4.5)
Glucose: 95 mg/dL (ref 65–99)
Potassium: 4.7 mmol/L (ref 3.5–5.2)
Sodium: 137 mmol/L (ref 134–144)
Total Protein: 7.4 g/dL (ref 6.0–8.5)

## 2019-03-01 LAB — CBC WITH DIFFERENTIAL/PLATELET
Basophils Absolute: 0.1 10*3/uL (ref 0.0–0.2)
Basos: 1 %
EOS (ABSOLUTE): 0.1 10*3/uL (ref 0.0–0.4)
Eos: 1 %
Hematocrit: 41.8 % (ref 34.0–46.6)
Hemoglobin: 14.1 g/dL (ref 11.1–15.9)
Immature Grans (Abs): 0 10*3/uL (ref 0.0–0.1)
Immature Granulocytes: 0 %
Lymphocytes Absolute: 2.1 10*3/uL (ref 0.7–3.1)
Lymphs: 33 %
MCH: 33.7 pg — ABNORMAL HIGH (ref 26.6–33.0)
MCHC: 33.7 g/dL (ref 31.5–35.7)
MCV: 100 fL — ABNORMAL HIGH (ref 79–97)
Monocytes Absolute: 0.4 10*3/uL (ref 0.1–0.9)
Monocytes: 7 %
Neutrophils Absolute: 3.7 10*3/uL (ref 1.4–7.0)
Neutrophils: 58 %
Platelets: 220 10*3/uL (ref 150–450)
RBC: 4.19 x10E6/uL (ref 3.77–5.28)
RDW: 12.4 % (ref 11.7–15.4)
WBC: 6.4 10*3/uL (ref 3.4–10.8)

## 2019-03-01 LAB — HEMOGLOBIN A1C
Est. average glucose Bld gHb Est-mCnc: 97 mg/dL
Hgb A1c MFr Bld: 5 % (ref 4.8–5.6)

## 2019-03-01 LAB — FOLATE: Folate: 5.7 ng/mL (ref >3.0)

## 2019-03-01 LAB — LIPID PANEL WITH LDL/HDL RATIO
Cholesterol, Total: 183 mg/dL (ref 100–199)
HDL: 71 mg/dL (ref >39)
LDL Chol Calc (NIH): 89 mg/dL (ref 0–99)
LDL/HDL Ratio: 1.3 ratio (ref 0.0–3.2)
Triglycerides: 134 mg/dL (ref 0–149)
VLDL Cholesterol Cal: 23 mg/dL (ref 5–40)

## 2019-03-01 LAB — INSULIN, RANDOM: INSULIN: 11.1 u[IU]/mL (ref 2.6–24.9)

## 2019-03-01 LAB — VITAMIN B12: Vitamin B-12: 399 pg/mL (ref 232–1245)

## 2019-03-01 LAB — TSH: TSH: 1.94 u[IU]/mL (ref 0.450–4.500)

## 2019-03-01 LAB — T4, FREE: Free T4: 0.95 ng/dL (ref 0.82–1.77)

## 2019-03-01 LAB — T3: T3, Total: 135 ng/dL (ref 71–180)

## 2019-03-01 LAB — VITAMIN D 25 HYDROXY (VIT D DEFICIENCY, FRACTURES): Vit D, 25-Hydroxy: 36.7 ng/mL (ref 30.0–100.0)

## 2019-03-01 NOTE — Telephone Encounter (Signed)
Please review

## 2019-03-05 NOTE — Progress Notes (Signed)
Office: [phone]  /  Fax: [phone]   HPI:   Chief Complaint: OBESITY  Amber Frost (MR# [id number]) is a 48 y.o. female who presents on 02/28/2019 for obesity evaluation and treatment. Current BMI is Body mass index is 38.79 kg/m. Amber Frost has struggled with obesity for years and has been unsuccessful in either losing weight or maintaining long term weight loss. [name] attended our information session and states she is currently in the action stage of change and ready to dedicate time achieving and maintaining a healthier weight.   Amber Frost was told about our clinic in from her sister. She states she is lactose intolerant. For breakfast, she is doing coffee with stevia and coffeemate, with or without egg (hard boiled). For lunch, she is doing [place], grilled chicken quesadilla or chicken soulaki, with or without water and/or diet Coke. For dinner, she is doing salmon seasonal veggies and 3 glasses of wine.  Amber Frost states her family eats meals together she thinks her family will eat healthier with  her her desired weight loss is 61 lbs she has been heavy most of  her life she started gaining weight after high school her heaviest weight ever was 263 lbs she has significant food cravings issues  she skips meals frequently she is frequently drinking liquids with calories she frequently eats larger portions than normal  she struggles with emotional eating    Amber Frost feels her energy is lower than it should be. This has worsened with weight gain and has not worsened recently. Arra admits to daytime somnolence and  admits to waking up still tired. Patient is at risk for obstructive sleep apnea. Patent has a history of symptoms of daytime Amber and morning headache. Patient generally gets 5 hours of sleep per night, and states they generally have difficulty falling asleep. Snoring is present. Apneic episodes are not present. Epworth Sleepiness Score is 10.  Dyspnea on exertion  Amber Frost notes increasing shortness of breath with exercising and seems to be worsening over time with weight gain. She notes getting out of breath sooner with activity than she used to. This has not gotten worse recently. EKG-poor R wave progression, normal sinus rhythm at 98 BPM. Amber Frost denies orthopnea.  Allergic Rhinitis Amber Frost has a diagnosis of allergic rhinitis. She is on Centirizine and is still struggling with symptoms.  Hypertension Amber Frost is a 48 y.o. female with hypertension. [name]'s blood pressure is elevated today, and has been elevated in the past. She denies chest pain. She is working on weight loss to help control her blood pressure with the goal of decreasing her risk of heart attack and stroke.   At risk for cardiovascular disease Amber Frost is at a higher than average risk for cardiovascular disease due to obesity and hypertension. She currently denies any chest pain.  Depression with Anxiety Amber Frost used to see a therapist. She notes Amber, and she shows no sign of suicidal or homicidal ideations.  Depression Screen Amber Frost's Food and Mood (modified PHQ-9) score was  Depression screen PHQ 2/9 02/28/2019  Decreased Interest 1  Down, Depressed, Hopeless 2  PHQ - 2 Score 3  Altered sleeping 2  Tired, decreased energy 2  Change in appetite 3  Feeling bad or failure about yourself  0  Trouble concentrating 3  Moving slowly or fidgety/restless 2  Suicidal thoughts 0  PHQ-9 Score 15  Difficult doing work/chores Very difficult    ASSESSMENT AND PLAN:  Other Amber - Plan: EKG 12-Lead, Vitamin B12,  CBC with Differential/Platelet, Comprehensive metabolic panel, Folate, Hemoglobin A1c, Insulin, random, T3, T4, free, TSH, VITAMIN D 25 Hydroxy (Vit-D Deficiency, Fractures)  Shortness of breath on exertion - Plan: Lipid Panel With LDL/HDL Ratio  Seasonal allergic rhinitis due to other allergic trigger  Essential hypertension - Plan: Comprehensive metabolic panel,  chlorthalidone (HYGROTON) 25 MG tablet  Depression with anxiety - Plan: Ambulatory referral to Psychology  At risk for heart disease  Class 2 severe obesity with serious comorbidity and body mass index (BMI) of 38.0 to 38.9 in adult, unspecified obesity type ([place])  PLAN:  Amber Amber Frost was informed that her Amber may be related to obesity, depression or many other causes. Labs will be ordered, and in the meanwhile [name] has agreed to work on diet, exercise and weight loss to help with Amber. Proper sleep hygiene was discussed including the need for 7-8 hours of quality sleep each night. A sleep study was not ordered based on symptoms and Epworth score.  Dyspnea on exertion Amber Frost's shortness of breath appears to be obesity related and exercise induced. She has agreed to work on weight loss and gradually increase exercise to treat her exercise induced shortness of breath. If Evian follows our instructions and loses weight without improvement of her shortness of breath, we will plan to refer to pulmonology. We will monitor this condition regularly. Amber Frost agrees to this plan.  Allergic Rhinitis Amber Frost was encouraged to start Flonase or Nasacort. Amber Frost agrees to follow up with our clinic in 2 weeks.  Hypertension We discussed sodium restriction, working on healthy weight loss, and a regular exercise program as the means to achieve improved blood pressure control. Amber Frost agreed with this plan and agreed to follow up as directed. We will continue to monitor her blood pressure as well as her progress with the above lifestyle modifications. Amber Frost agrees to start chlorthalidone 25 mg PO daily #30 with no refills. She will watch for signs of hypotension as she continues her lifestyle modifications. We will need to repeat CMP at her next appointment. Amber Frost agrees to follow up with our clinic in 2 weeks.  Cardiovascular risk counseling Amber Frost was given extended (30 minutes) coronary artery  disease prevention counseling today. She is 48 y.o. female and has risk factors for heart disease including obesity and hypertension. We discussed intensive lifestyle modifications today with an emphasis on specific weight loss instructions and strategies. Pt was also informed of the importance of increasing exercise and decreasing saturated fats to help prevent heart disease.  Depression with Anxiety We discussed behavior modification techniques today to help Denah deal with her emotional eating and depression. We have sent a referral to Psychology at [facility] for evaluation. Hinsley agrees to follow up with our clinic in 2 weeks.  Depression Screen Atenas had a strongly positive depression screening. Depression is commonly associated with obesity and often results in emotional eating behaviors. We will monitor this closely and work on CBT to help improve the non-hunger eating patterns. Referral to Psychology may be required if no improvement is seen as she continues in our clinic.  Obesity Valley is currently in the action stage of change and her goal is to continue with weight loss efforts She has agreed to follow the Category 3 plan Carmalita has been instructed to work up to a goal of 150 minutes of combined cardio and strengthening exercise per week for weight loss and overall health benefits. We discussed the following Behavioral Modification Strategies today: increasing lean protein intake, increasing vegetables  and work on meal planning and easy cooking plans, keeping healthy foods in the home, and planning for success  Kiira has agreed to follow up with our clinic in 2 weeks. She was informed of the importance of frequent follow up visits to maximize her success with intensive lifestyle modifications for her multiple health conditions. She was informed we would discuss her lab results at her next visit unless there is a critical issue that needs to be addressed sooner. Vashanti agreed to keep  her next visit at the agreed upon time to discuss these results.  ALLERGIES: Allergies  Allergen Reactions  . Sulfa Antibiotics Rash    MEDICATIONS: Current Outpatient Medications on File Prior to Visit  Medication Sig Dispense Refill  . ALPRAZolam (XANAX) 1 MG tablet TAKE 1 TABLET (1 MG TOTAL) BY MOUTH 3 (THREE) TIMES DAILY AS NEEDED. 60 tablet 1  . buPROPion (WELLBUTRIN XL) 300 MG 24 hr tablet TAKE 1 TABLET BY MOUTH EVERY DAY 90 tablet 1  . Cetirizine HCl (ZYRTEC ALLERGY PO) Take by mouth.    . clobetasol cream (TEMOVATE) [id number] % Apply 1 application topically at bedtime. 60 g 2  . Fluticasone Propionate (FLONASE NA) Place into the nose.    Marland Kitchen lisinopril (ZESTRIL) 30 MG tablet TAKE 1 TABLET BY MOUTH EVERY DAY 90 tablet 1  . Norethindrone-Ethinyl Estradiol-Fe Biphas (LO LOESTRIN FE) 1 MG-10 MCG / 10 MCG tablet TAKE 1 TABLET BY MOUTH EVERY DAY 84 tablet 1  . predniSONE (DELTASONE) 10 MG tablet 3 tabs by mouth x 3 days, 2 tabs by mouth x 2 days, 1 tab by mouth x 2 days and stop. 15 tablet 0  . sertraline (ZOLOFT) 50 MG tablet TAKE 1QD (PLZ SCHED VIRTUAL VISIT FOR FUTURE FILLS) 90 tablet 0  . traZODone (DESYREL) 50 MG tablet TAKE 0.5-1 TABLETS (25-50 MG TOTAL) BY MOUTH AT BEDTIME AS NEEDED FOR SLEEP. 90 tablet 1   No current facility-administered medications on file prior to visit.     PAST MEDICAL HISTORY: Past Medical History:  Diagnosis Date  . Allergies   . Anxiety   . Back pain   . Depression   . Depression   . Gallbladder problem   . Heartburn   . Hypertension   . Irritable bowel syndrome (IBS)   . Joint pain   . Sinusitis     PAST SURGICAL HISTORY: Past Surgical History:  Procedure Laterality Date  . GALLBLADDER SURGERY  2012  . NASAL SINUS SURGERY    . NASAL SINUS SURGERY      SOCIAL HISTORY: Social History   Tobacco Use  . Smoking status: Never Smoker  . Smokeless tobacco: Never Used  Substance Use Topics  . Alcohol use: Yes    Alcohol/week: 0.0 standard  drinks  . Drug use: No    FAMILY HISTORY: Family History  Problem Relation Age of Onset  . Diabetes Other   . Hypertension Other   . Breast cancer Paternal Aunt 24  . Depression Mother     ROS: Review of Systems  Constitutional: Positive for malaise/Amber. Negative for weight loss.       + Trouble sleeping  HENT: Positive for sinus pain.        + Decreased hearing + Mouth sores  Eyes:       + Vision changes  + Wear glasses or contacts + Floaters  Respiratory: Positive for cough and shortness of breath (with exertion).   Cardiovascular: Negative for chest pain and orthopnea.  Gastrointestinal:  Positive for diarrhea, heartburn and nausea.  Musculoskeletal:       + Neck stiffness  Skin:       + Dryness  Neurological: Positive for dizziness.  Endo/Heme/Allergies: Bruises/bleeds easily.       + Excessive thirst  Psychiatric/Behavioral: Positive for depression. Negative for suicidal ideas.       + Anxiety + Stress    PHYSICAL EXAM: Blood pressure (!) 165/118, pulse 99, temperature 98 F (36.7 C), temperature source Oral, height 5\' 4"  (1.626 m), weight 226 lb (102.5 kg), SpO2 98 %. Body mass index is 38.79 kg/m. Physical Exam Vitals signs reviewed.  Constitutional:      Appearance: Normal appearance. She is obese.  HENT:     Head: Normocephalic and atraumatic.     Nose: Nose normal.  Eyes:     General: No scleral icterus.    Extraocular Movements: Extraocular movements intact.  Neck:     Musculoskeletal: Normal range of motion and neck supple.     Comments: No thyromegaly present Cardiovascular:     Rate and Rhythm: Normal rate and regular rhythm.     Pulses: Normal pulses.     Heart sounds: Normal heart sounds.  Pulmonary:     Effort: Pulmonary effort is normal. No respiratory distress.     Breath sounds: Normal breath sounds.  Abdominal:     Palpations: Abdomen is soft.     Tenderness: There is no abdominal tenderness.     Comments: + Obesity   Musculoskeletal: Normal range of motion.     Right lower leg: No edema.     Left lower leg: No edema.  Skin:    General: Skin is warm and dry.  Neurological:     Mental Status: She is alert and oriented to person, place, and time.     Coordination: Coordination normal.  Psychiatric:        Mood and Affect: Mood normal.        Behavior: Behavior normal.     RECENT LABS AND TESTS: BMET    Component Value Date/Time   NA 137 02/28/2019 0945   NA 139 10/04/2011 0843   K 4.7 02/28/2019 0945   K 3.8 10/04/2011 0843   CL 102 02/28/2019 0945   CL 106 10/04/2011 0843   CO2 21 02/28/2019 0945   CO2 28 10/04/2011 0843   GLUCOSE 95 02/28/2019 0945   GLUCOSE 95 11/15/2017 1409   GLUCOSE 91 10/04/2011 0843   BUN 12 02/28/2019 0945   BUN 9 10/04/2011 0843   CREATININE 0.73 02/28/2019 0945   CREATININE 0.82 10/04/2011 0843   CALCIUM 9.0 02/28/2019 0945   CALCIUM 9.1 10/04/2011 0843   GFRNONAA 98 02/28/2019 0945   GFRNONAA >60 10/04/2011 0843   GFRAA 113 02/28/2019 0945   GFRAA >60 10/04/2011 0843   Lab Results  Component Value Date   HGBA1C 5.0 02/28/2019   Lab Results  Component Value Date   INSULIN 11.1 02/28/2019   CBC    Component Value Date/Time   WBC 6.4 02/28/2019 0945   WBC 6.3 11/15/2017 1409   RBC 4.19 02/28/2019 0945   RBC 3.91 11/15/2017 1409   HGB 14.1 02/28/2019 0945   HCT 41.8 02/28/2019 0945   PLT 220 02/28/2019 0945   MCV 100 (H) 02/28/2019 0945   MCV 95 10/04/2011 0843   MCH 33.7 (H) 02/28/2019 0945   MCH 32.8 10/04/2011 0843   MCHC 33.7 02/28/2019 0945   MCHC 34.8 11/15/2017 1409   RDW  12.4 02/28/2019 0945   RDW 13.2 10/04/2011 0843   LYMPHSABS 2.1 02/28/2019 0945   MONOABS 0.5 11/15/2017 1409   EOSABS 0.1 02/28/2019 0945   BASOSABS 0.1 02/28/2019 0945   Iron/TIBC/Ferritin/ %Sat No results found for: IRON, TIBC, FERRITIN, IRONPCTSAT Lipid Panel     Component Value Date/Time   CHOL 183 02/28/2019 0945   TRIG 134 02/28/2019 0945   HDL 71  02/28/2019 0945   CHOLHDL 3 11/15/2017 1409   VLDL 44.6 (H) 11/15/2017 1409   LDLCALC 72 08/10/2016 1252   LDLDIRECT 89.0 11/15/2017 1409   Hepatic Function Panel     Component Value Date/Time   PROT 7.4 02/28/2019 0945   PROT 8.0 10/04/2011 0843   ALBUMIN 4.6 02/28/2019 0945   ALBUMIN 3.7 10/04/2011 0843   AST 23 02/28/2019 0945   AST 20 10/04/2011 0843   ALT 23 02/28/2019 0945   ALT 27 10/04/2011 0843   ALKPHOS 64 02/28/2019 0945   ALKPHOS 83 10/04/2011 0843   BILITOT 0.4 02/28/2019 0945   BILITOT 0.5 10/04/2011 0843   BILIDIR 0.1 02/03/2010 1134      Component Value Date/Time   TSH 1.940 02/28/2019 0945   Vitamin D No recent labs  ECG  shows NSR with a rate of 98 BPM INDIRECT CALORIMETER done today shows a VO2 of 282 and a REE of 1962. Her calculated basal metabolic rate is [id number] thus her basal metabolic rate is better than expected.       OBESITY BEHAVIORAL INTERVENTION VISIT  Today's visit was # 1   Starting weight: 226 lbs Starting date: 02/28/2019 Today's weight : 226 lbs  Today's date: 02/28/2019 Total lbs lost to date: 0    ASK: We discussed the diagnosis of obesity with Alberteen Sam today and Ortencia agreed to give [place] permission to discuss obesity behavioral modification therapy today.  ASSESS: Soul has the diagnosis of obesity and her BMI today is 38.77 Lateria is in the action stage of change   ADVISE: Henchy was educated on the multiple health risks of obesity as well as the benefit of weight loss to improve her health. She was advised of the need for long term treatment and the importance of lifestyle modifications to improve her current health and to decrease her risk of future health problems.  AGREE: Multiple dietary modification options and treatment options were discussed and  [place] agreed to follow the recommendations documented in the above note.  ARRANGE: Ziaire was educated on the importance of frequent visits to treat obesity as  outlined per CMS and USPSTF guidelines and agreed to schedule her next follow up appointment today.   I, Trixie Dredge, am acting as transcriptionist for Ilene Qua, MD   I have reviewed the above documentation for accuracy and completeness, and I agree with the above. - Ilene Qua, MD

## 2019-03-14 ENCOUNTER — Ambulatory Visit (INDEPENDENT_AMBULATORY_CARE_PROVIDER_SITE_OTHER): Payer: BC Managed Care – PPO | Admitting: Family Medicine

## 2019-03-14 ENCOUNTER — Other Ambulatory Visit: Payer: Self-pay

## 2019-03-14 ENCOUNTER — Encounter (INDEPENDENT_AMBULATORY_CARE_PROVIDER_SITE_OTHER): Payer: Self-pay | Admitting: Family Medicine

## 2019-03-14 VITALS — BP 157/94 | HR 106 | Temp 98.3°F | Ht 64.0 in | Wt 220.0 lb

## 2019-03-14 DIAGNOSIS — Z9189 Other specified personal risk factors, not elsewhere classified: Secondary | ICD-10-CM

## 2019-03-14 DIAGNOSIS — I10 Essential (primary) hypertension: Secondary | ICD-10-CM | POA: Diagnosis not present

## 2019-03-14 DIAGNOSIS — E8881 Metabolic syndrome: Secondary | ICD-10-CM

## 2019-03-14 DIAGNOSIS — Z6837 Body mass index (BMI) 37.0-37.9, adult: Secondary | ICD-10-CM

## 2019-03-14 DIAGNOSIS — F3289 Other specified depressive episodes: Secondary | ICD-10-CM

## 2019-03-14 DIAGNOSIS — E559 Vitamin D deficiency, unspecified: Secondary | ICD-10-CM

## 2019-03-14 MED ORDER — BUPROPION HCL ER (SR) 150 MG PO TB12: 150.0000 mg | ORAL_TABLET | ORAL | 0 refills | Status: DC

## 2019-03-14 MED ORDER — VITAMIN D (ERGOCALCIFEROL) 1.25 MG (50000 UNIT) PO CAPS: 50000.0000 [IU] | ORAL_CAPSULE | ORAL | 0 refills | Status: DC

## 2019-03-15 NOTE — Progress Notes (Signed)
Office: [phone]  /  Fax: [phone]   HPI:   Chief Complaint: OBESITY Amber Frost is here to discuss her progress with her obesity treatment plan. She is on the Category 3 plan and is following her eating plan approximately 80 % of the time. She states she is moving boxes and things for 3 hours 4 times per week. Amber Frost has had work done at her house and has been living at a hotel. She likes the food on the plan and she denies hunger or cravings. She is using snack calories for wine. Work on the house is to be done in 2 days. She denies any obstacles in the next few weeks.  Her weight is 220 lb (99.8 kg) today and has had a weight loss of 6 pounds over a period of 2 weeks since her last visit. She has lost 6 lbs since starting treatment with [place].  Vitamin D Deficiency Amber Frost has a diagnosis of vitamin D deficiency. She is not currently taking Vit D supplementation. She denies nausea, vomiting or muscle weakness.  At risk for osteopenia and osteoporosis Amber Frost is at higher risk of osteopenia and osteoporosis due to vitamin D deficiency.   Insulin Resistance Amber Frost has a new diagnosis of insulin resistance based on her elevated fasting insulin level >5. Last Hgb A1c was of 5.0 and insulin of 11.1. Although Amber Frost's blood glucose readings are still under good control, insulin resistance puts her at greater risk of metabolic syndrome and diabetes. She is not taking metformin currently and continues to work on diet and exercise to decrease risk of diabetes.  Hypertension Amber Frost is a 48 y.o. female with hypertension. Amber Frost's blood pressure is uncontrolled again. She denies chest pain, chest pressure, or headaches. She is working on weight loss to help control her blood pressure with the goal of decreasing her risk of heart attack and stroke.  Depression with Emotional Eating Behaviors Amber Frost notes her symptoms are relatively controlled. Amber Frost struggles with emotional eating and using  food for comfort to the extent that it is negatively impacting her health. She often snacks when she is not hungry. Amber Frost sometimes feels she is out of control and then feels guilty that she made poor food choices. She has been working on behavior modification techniques to help reduce her emotional eating and has been somewhat successful. She shows no sign of suicidal or homicidal ideations.  Depression screen Amber Frost 2/9 02/28/2019 11/15/2017 08/10/2016 06/17/2015 09/13/2014  Decreased Interest 1 1 0 0 0  Down, Depressed, Hopeless 2 1 0 0 0  PHQ - 2 Score 3 2 0 0 0  Altered sleeping 2 3 - - -  Tired, decreased energy 2 2 - - -  Change in appetite 3 0 - - -  Feeling bad or failure about yourself  0 0 - - -  Trouble concentrating 3 3 - - -  Moving slowly or fidgety/restless 2 0 - - -  Suicidal thoughts 0 0 - - -  PHQ-9 Score 15 10 - - -  Difficult doing work/chores Very difficult Somewhat difficult - - -    ASSESSMENT AND PLAN:  Vitamin D deficiency - Plan: Vitamin D, Ergocalciferol, (DRISDOL) 1.25 MG (50000 UT) CAPS capsule  Insulin resistance - Plan: Comprehensive metabolic panel  Essential hypertension  Other depression - Plan: buPROPion (WELLBUTRIN SR) 150 MG 12 hr tablet  At risk for osteoporosis  Class 2 severe obesity with serious comorbidity and body mass index (BMI) of 37.0 to  37.9 in adult, unspecified obesity type (Amber Frost)  PLAN:  Vitamin D Deficiency Amber Frost was informed that low vitamin D levels contributes to fatigue and are associated with obesity, breast, and colon cancer. [name] agrees to start prescription Vit D 50,000 IU every week #4 with no refills. She will follow up for routine testing of vitamin D, at least 2-3 times per year. She was informed of the risk of over-replacement of vitamin D and agrees to not increase her dose unless she discusses this with [place] first. [name] agrees to follow up with our clinic in 2 weeks.  At risk for osteopenia and osteoporosis Amber Frost was  given extended (30 minutes) osteoporosis prevention counseling today. [name] is at risk for osteopenia and osteoporsis due to her vitamin D deficiency. She was encouraged to take her vitamin D and follow her higher calcium diet and increase strengthening exercise to help strengthen her bones and decrease her risk of osteopenia and osteoporosis.  Insulin Resistance Amber Frost will continue to work on weight loss, exercise, and decreasing simple carbohydrates in her diet to help decrease the risk of diabetes. We dicussed metformin including benefits and risks. She was informed that eating too many simple carbohydrates or too many calories at one sitting increases the likelihood of GI side effects. We will repeat labs in 3 months. Amber Frost agrees to follow up with [place] as directed to monitor her progress.  Hypertension We discussed sodium restriction, working on healthy weight loss, and a regular exercise program as the means to achieve improved blood pressure control. Amber Frost agreed with this plan and agreed to follow up as directed. We will continue to monitor her blood pressure as well as her progress with the above lifestyle modifications. [name] agrees to continue her medications and will watch for signs of hypotension as she continues her lifestyle modifications. We will repeat CMP within the next week. Amber Frost agrees to follow up with our clinic in 2 weeks.  Depression with Emotional Eating Behaviors We discussed behavior modification techniques today to help Amber Frost deal with her emotional eating and depression. Amber Frost agrees to change to Wellbutrin SR 150 mg 1.5 tablet PO daily #45 with no refills. [name] agrees to follow up with our clinic in 2 weeks.  Obesity Amber Frost is currently in the action stage of change. As such, her goal is to continue with weight loss efforts She has agreed to follow the Category 3 plan Amber Frost has been instructed to work up to a goal of 150 minutes of combined cardio and  strengthening exercise per week for weight loss and overall health benefits. We discussed the following Behavioral Modification Strategies today: increasing lean protein intake, increasing vegetables and work on meal planning and easy cooking plans, keeping healthy foods in the home, and planning for success   Amber Frost has agreed to follow up with our clinic in 2 weeks. She was informed of the importance of frequent follow up visits to maximize her success with intensive lifestyle modifications for her multiple health conditions.  ALLERGIES: Allergies  Allergen Reactions   Sulfa Antibiotics Rash    MEDICATIONS: Current Outpatient Medications on File Prior to Visit  Medication Sig Dispense Refill   ALPRAZolam (XANAX) 1 MG tablet TAKE 1 TABLET (1 MG TOTAL) BY MOUTH 3 (THREE) TIMES DAILY AS NEEDED. 60 tablet 1   buPROPion (WELLBUTRIN XL) 300 MG 24 hr tablet TAKE 1 TABLET BY MOUTH EVERY DAY 90 tablet 1   Cetirizine HCl (ZYRTEC ALLERGY PO) Take by mouth.     chlorthalidone (  HYGROTON) 25 MG tablet Take 1 tablet (25 mg total) by mouth daily. 30 tablet 0   clobetasol cream (TEMOVATE) [id number] % Apply 1 application topically at bedtime. 60 g 2   Fluticasone Propionate (FLONASE NA) Place into the nose.     lisinopril (ZESTRIL) 30 MG tablet TAKE 1 TABLET BY MOUTH EVERY DAY 90 tablet 1   Norethindrone-Ethinyl Estradiol-Fe Biphas (LO LOESTRIN FE) 1 MG-10 MCG / 10 MCG tablet TAKE 1 TABLET BY MOUTH EVERY DAY 84 tablet 1   predniSONE (DELTASONE) 10 MG tablet 3 tabs by mouth x 3 days, 2 tabs by mouth x 2 days, 1 tab by mouth x 2 days and stop. 15 tablet 0   sertraline (ZOLOFT) 50 MG tablet TAKE 1QD (PLZ SCHED VIRTUAL VISIT FOR FUTURE FILLS) 90 tablet 0   traZODone (DESYREL) 50 MG tablet TAKE 0.5-1 TABLETS (25-50 MG TOTAL) BY MOUTH AT BEDTIME AS NEEDED FOR SLEEP. 90 tablet 1   No current facility-administered medications on file prior to visit.     PAST MEDICAL HISTORY: Past Medical History:    Diagnosis Date   Allergies    Anxiety    Back pain    Depression    Depression    Gallbladder problem    Heartburn    Hypertension    Irritable bowel syndrome (IBS)    Joint pain    Sinusitis     PAST SURGICAL HISTORY: Past Surgical History:  Procedure Laterality Date   GALLBLADDER SURGERY  2012   NASAL SINUS SURGERY     NASAL SINUS SURGERY      SOCIAL HISTORY: Social History   Tobacco Use   Smoking status: Never Smoker   Smokeless tobacco: Never Used  Substance Use Topics   Alcohol use: Yes    Alcohol/week: 0.0 standard drinks   Drug use: No    FAMILY HISTORY: Family History  Problem Relation Age of Onset   Diabetes Other    Hypertension Other    Breast cancer Paternal Aunt 59   Depression Mother     ROS: Review of Systems  Constitutional: Positive for weight loss.  Cardiovascular: Negative for chest pain.       Negative chest pressure  Gastrointestinal: Negative for nausea and vomiting.  Musculoskeletal:       Negative muscle weakness  Neurological: Negative for headaches.  Psychiatric/Behavioral: Positive for depression. Negative for suicidal ideas.    PHYSICAL EXAM: Blood pressure (!) 157/94, pulse (!) 106, temperature 98.3 F (36.8 C), temperature source Oral, height 5\' 4"  (1.626 m), weight 220 lb (99.8 kg), SpO2 96 %. Body mass index is 37.76 kg/m. Physical Exam Vitals signs reviewed.  Constitutional:      Appearance: Normal appearance. She is obese.  Cardiovascular:     Rate and Rhythm: Normal rate.     Pulses: Normal pulses.  Pulmonary:     Effort: Pulmonary effort is normal.     Breath sounds: Normal breath sounds.  Musculoskeletal: Normal range of motion.  Skin:    General: Skin is warm and dry.  Neurological:     Mental Status: She is alert and oriented to person, place, and time.  Psychiatric:        Mood and Affect: Mood normal.        Behavior: Behavior normal.     RECENT LABS AND TESTS: BMET     Component Value Date/Time   NA 137 02/28/2019 0945   NA 139 10/04/2011 0843   K 4.7 02/28/2019 0945  K 3.8 10/04/2011 0843   CL 102 02/28/2019 0945   CL 106 10/04/2011 0843   CO2 21 02/28/2019 0945   CO2 28 10/04/2011 0843   GLUCOSE 95 02/28/2019 0945   GLUCOSE 95 11/15/2017 1409   GLUCOSE 91 10/04/2011 0843   BUN 12 02/28/2019 0945   BUN 9 10/04/2011 0843   CREATININE 0.73 02/28/2019 0945   CREATININE 0.82 10/04/2011 0843   CALCIUM 9.0 02/28/2019 0945   CALCIUM 9.1 10/04/2011 0843   GFRNONAA 98 02/28/2019 0945   GFRNONAA >60 10/04/2011 0843   GFRAA 113 02/28/2019 0945   GFRAA >60 10/04/2011 0843   Lab Results  Component Value Date   HGBA1C 5.0 02/28/2019   Lab Results  Component Value Date   INSULIN 11.1 02/28/2019   CBC    Component Value Date/Time   WBC 6.4 02/28/2019 0945   WBC 6.3 11/15/2017 1409   RBC 4.19 02/28/2019 0945   RBC 3.91 11/15/2017 1409   HGB 14.1 02/28/2019 0945   HCT 41.8 02/28/2019 0945   PLT 220 02/28/2019 0945   MCV 100 (H) 02/28/2019 0945   MCV 95 10/04/2011 0843   MCH 33.7 (H) 02/28/2019 0945   MCH 32.8 10/04/2011 0843   MCHC 33.7 02/28/2019 0945   MCHC 34.8 11/15/2017 1409   RDW 12.4 02/28/2019 0945   RDW 13.2 10/04/2011 0843   LYMPHSABS 2.1 02/28/2019 0945   MONOABS 0.5 11/15/2017 1409   EOSABS 0.1 02/28/2019 0945   BASOSABS 0.1 02/28/2019 0945   Iron/TIBC/Ferritin/ %Sat No results found for: IRON, TIBC, FERRITIN, IRONPCTSAT Lipid Panel     Component Value Date/Time   CHOL 183 02/28/2019 0945   TRIG 134 02/28/2019 0945   HDL 71 02/28/2019 0945   CHOLHDL 3 11/15/2017 1409   VLDL 44.6 (H) 11/15/2017 1409   LDLCALC 89 02/28/2019 0945   LDLDIRECT 89.0 11/15/2017 1409   Hepatic Function Panel     Component Value Date/Time   PROT 7.4 02/28/2019 0945   PROT 8.0 10/04/2011 0843   ALBUMIN 4.6 02/28/2019 0945   ALBUMIN 3.7 10/04/2011 0843   AST 23 02/28/2019 0945   AST 20 10/04/2011 0843   ALT 23 02/28/2019 0945   ALT 27  10/04/2011 0843   ALKPHOS 64 02/28/2019 0945   ALKPHOS 83 10/04/2011 0843   BILITOT 0.4 02/28/2019 0945   BILITOT 0.5 10/04/2011 0843   BILIDIR 0.1 02/03/2010 1134      Component Value Date/Time   TSH 1.940 02/28/2019 0945   TSH 1.07 11/15/2017 1409   TSH 1.71 08/10/2016 1252      OBESITY BEHAVIORAL INTERVENTION VISIT  Today's visit was # 2   Starting weight: 226 lbs Starting date: 02/28/2019 Today's weight : 220 lbs  Today's date: 03/14/2019 Total lbs lost to date: 6    ASK: We discussed the diagnosis of obesity with Alberteen Sam today and Henryetta agreed to give [place] permission to discuss obesity behavioral modification therapy today.  ASSESS: Siyah has the diagnosis of obesity and her BMI today is 37.74 Raynisha is in the action stage of change   ADVISE: Kiyomi was educated on the multiple health risks of obesity as well as the benefit of weight loss to improve her health. She was advised of the need for long term treatment and the importance of lifestyle modifications to improve her current health and to decrease her risk of future health problems.  AGREE: Multiple dietary modification options and treatment options were discussed and  Amber Frost agreed to follow  the recommendations documented in the above note.  ARRANGE: Reo was educated on the importance of frequent visits to treat obesity as outlined per CMS and USPSTF guidelines and agreed to schedule her next follow up appointment today.  I, Trixie Dredge, am acting as transcriptionist for Ilene Qua, MD  I have reviewed the above documentation for accuracy and completeness, and I agree with the above. - Ilene Qua, MD

## 2019-03-23 ENCOUNTER — Other Ambulatory Visit (INDEPENDENT_AMBULATORY_CARE_PROVIDER_SITE_OTHER): Payer: Self-pay | Admitting: Family Medicine

## 2019-03-23 DIAGNOSIS — I10 Essential (primary) hypertension: Secondary | ICD-10-CM

## 2019-03-28 ENCOUNTER — Ambulatory Visit (INDEPENDENT_AMBULATORY_CARE_PROVIDER_SITE_OTHER): Payer: BC Managed Care – PPO | Admitting: Bariatrics

## 2019-04-03 ENCOUNTER — Other Ambulatory Visit: Payer: Self-pay

## 2019-04-03 ENCOUNTER — Ambulatory Visit (INDEPENDENT_AMBULATORY_CARE_PROVIDER_SITE_OTHER): Payer: BC Managed Care – PPO | Admitting: Bariatrics

## 2019-04-03 ENCOUNTER — Encounter (INDEPENDENT_AMBULATORY_CARE_PROVIDER_SITE_OTHER): Payer: Self-pay | Admitting: Bariatrics

## 2019-04-03 ENCOUNTER — Ambulatory Visit: Payer: BC Managed Care – PPO | Admitting: Psychology

## 2019-04-03 VITALS — BP 147/89 | HR 89 | Temp 98.3°F | Ht 64.0 in | Wt 225.0 lb

## 2019-04-03 DIAGNOSIS — Z9189 Other specified personal risk factors, not elsewhere classified: Secondary | ICD-10-CM | POA: Diagnosis not present

## 2019-04-03 DIAGNOSIS — I10 Essential (primary) hypertension: Secondary | ICD-10-CM

## 2019-04-03 DIAGNOSIS — F3289 Other specified depressive episodes: Secondary | ICD-10-CM

## 2019-04-03 DIAGNOSIS — E559 Vitamin D deficiency, unspecified: Secondary | ICD-10-CM

## 2019-04-03 DIAGNOSIS — Z6838 Body mass index (BMI) 38.0-38.9, adult: Secondary | ICD-10-CM

## 2019-04-03 MED ORDER — CHLORTHALIDONE 25 MG PO TABS: 25.0000 mg | ORAL_TABLET | Freq: Every day | ORAL | 0 refills | Status: DC

## 2019-04-03 MED ORDER — VITAMIN D (ERGOCALCIFEROL) 1.25 MG (50000 UNIT) PO CAPS: 50000.0000 [IU] | ORAL_CAPSULE | ORAL | 0 refills | Status: DC

## 2019-04-03 NOTE — Progress Notes (Signed)
Office: [phone]  /  Fax: [phone]   HPI:   Chief Complaint: OBESITY Amber Frost is here to discuss her progress with her obesity treatment plan. She is on the Category 3 plan and is following her eating plan approximately 40% of the time. She states she is walking 30 minutes 5 times per week. Amber Frost is a patient of Dr. Adair Patter and this is her first visit with me. She has felt better and has not been eating out. Her weight is 225 lb (102.1 kg) today and has had a weight gain of 5 lbs since her last visit. She has lost 1 lb since starting treatment with [place].  Hypertension Amber Frost is a 48 y.o. female with hypertension. She reports not taking her medication this a.m. Amber Frost denies chest pain or shortness of breath on exertion. She is working weight loss to help control her blood pressure with the goal of decreasing her risk of heart attack and stroke. Amber Frost's blood pressure is 147/89 today.  Vitamin D deficiency Amber Frost has a diagnosis of Vitamin D deficiency. Last Vitamin D 36.7 on 02/28/2019. She is currently taking prescription Vit D and denies nausea, vomiting or muscle weakness.  At risk for osteopenia and osteoporosis Amber Frost is at higher risk of osteopenia and osteoporosis due to Vitamin D deficiency.   Depression with emotional eating behaviors Amber Frost is struggling with emotional eating and using food for comfort to the extent that it is negatively impacting her health. She often snacks when she is not hungry. Amber Frost sometimes feels she is out of control and then feels guilty that she made poor food choices. She has been working on behavior modification techniques to help reduce her emotional eating and has been somewhat successful. Amber Frost is taking Wellbutrin and reports emotional and stress eating. She shows no sign of suicidal or homicidal ideations.  Depression screen Amber Frost 2/9 02/28/2019 11/15/2017 08/10/2016 06/17/2015 09/13/2014  Decreased Interest 1 1 0 0 0  Down,  Depressed, Hopeless 2 1 0 0 0  PHQ - 2 Score 3 2 0 0 0  Altered sleeping 2 3 - - -  Tired, decreased energy 2 2 - - -  Change in appetite 3 0 - - -  Feeling bad or failure about yourself  0 0 - - -  Trouble concentrating 3 3 - - -  Moving slowly or fidgety/restless 2 0 - - -  Suicidal thoughts 0 0 - - -  PHQ-9 Score 15 10 - - -  Difficult doing work/chores Very difficult Somewhat difficult - - -   ASSESSMENT AND PLAN:  Essential hypertension - Plan: chlorthalidone (HYGROTON) 25 MG tablet  Vitamin D deficiency - Plan: Vitamin D, Ergocalciferol, (DRISDOL) 1.25 MG (50000 UT) CAPS capsule  Other depression - with emotional eating  At risk for osteoporosis  Class 2 severe obesity with serious comorbidity and body mass index (BMI) of 38.0 to 38.9 in adult, unspecified obesity type (HCC)  PLAN:  Hypertension We discussed sodium restriction, working on healthy weight loss, and a regular exercise program as the means to achieve improved blood pressure control. Amber Frost agreed with this plan and agreed to follow up as directed. We will continue to monitor her blood pressure as well as her progress with the above lifestyle modifications. She will continue her medications as prescribed. She was given a prescription for chlorthalidone 25 mg 1 PO daily #30 with 0 refills and will follow-up with our clinic in 2-3 weeks. She will watch for  signs of hypotension as she continues her lifestyle modifications.  Vitamin D Deficiency Amber Frost was informed that low Vitamin D levels contributes to fatigue and are associated with obesity, breast, and colon cancer. She agrees to continue to take prescription Vit D @ 50,000 IU every week #4 with 0 refills and will follow-up for routine testing of Vitamin D, at least 2-3 times per year. She was informed of the risk of over-replacement of Vitamin D and agrees to not increase her dose unless she discusses this with [place] first. Amber Frost agrees to follow-up with our clinic in  2-3 weeks.  At risk for osteopenia and osteoporosis Amber Frost was given extended  (15 minutes) osteoporosis prevention counseling today. Amber Frost is at risk for osteopenia and osteoporosis due to her Vitamin D deficiency. She was encouraged to take her Vitamin D and follow her higher calcium diet and increase strengthening exercise to help strengthen her bones and decrease her risk of osteopenia and osteoporosis.  Depression with Emotional Eating Behaviors We discussed behavior modification techniques today to help Amber Frost deal with her emotional eating and depression. Amber Frost has an appointment with Dr. Mallie Mussel and will follow-up as directed.  Obesity Amber Frost is currently in the action stage of change. As such, her goal is to continue with weight loss efforts. She has agreed to follow the Category 3 plan. We discussed with Amber Frost ways to deal with unexpected events/stress. She will work on meal planning, will adhere more closely to the plan, and increase her water intake. Amber Frost has been instructed to work up to a goal of 150 minutes of combined cardio and strengthening exercise per week for weight loss and overall health benefits. We discussed the following Behavioral Modification Strategies today: increasing lean protein intake, decreasing simple carbohydrates, increasing vegetables, increase H20 intake, better snacking choices, and emotional eating strategies.  Amber Frost has agreed to follow-up with our clinic in 2-3 weeks. She was informed of the importance of frequent follow-up visits to maximize her success with intensive lifestyle modifications for her multiple health conditions.  ALLERGIES: Allergies  Allergen Reactions  . Sulfa Antibiotics Rash    MEDICATIONS: Current Outpatient Medications on File Prior to Visit  Medication Sig Dispense Refill  . ALPRAZolam (XANAX) 1 MG tablet TAKE 1 TABLET (1 MG TOTAL) BY MOUTH 3 (THREE) TIMES DAILY AS NEEDED. 60 tablet 1  . buPROPion (WELLBUTRIN SR)  150 MG 12 hr tablet Take 1 tablet (150 mg total) by mouth as directed. Take 1.5 daily 45 tablet 0  . buPROPion (WELLBUTRIN XL) 300 MG 24 hr tablet TAKE 1 TABLET BY MOUTH EVERY DAY 90 tablet 1  . Cetirizine HCl (ZYRTEC ALLERGY PO) Take by mouth.    . clobetasol cream (TEMOVATE) [id number] % Apply 1 application topically at bedtime. 60 g 2  . Fluticasone Propionate (FLONASE NA) Place into the nose.    Marland Kitchen lisinopril (ZESTRIL) 30 MG tablet TAKE 1 TABLET BY MOUTH EVERY DAY 90 tablet 1  . Norethindrone-Ethinyl Estradiol-Fe Biphas (LO LOESTRIN FE) 1 MG-10 MCG / 10 MCG tablet TAKE 1 TABLET BY MOUTH EVERY DAY 84 tablet 1  . predniSONE (DELTASONE) 10 MG tablet 3 tabs by mouth x 3 days, 2 tabs by mouth x 2 days, 1 tab by mouth x 2 days and stop. 15 tablet 0  . sertraline (ZOLOFT) 50 MG tablet TAKE 1QD (PLZ SCHED VIRTUAL VISIT FOR FUTURE FILLS) 90 tablet 0  . traZODone (DESYREL) 50 MG tablet TAKE 0.5-1 TABLETS (25-50 MG TOTAL) BY MOUTH AT BEDTIME AS NEEDED  FOR SLEEP. 90 tablet 1   No current facility-administered medications on file prior to visit.     PAST MEDICAL HISTORY: Past Medical History:  Diagnosis Date  . Allergies   . Anxiety   . Back pain   . Depression   . Depression   . Gallbladder problem   . Heartburn   . Hypertension   . Irritable bowel syndrome (IBS)   . Joint pain   . Sinusitis     PAST SURGICAL HISTORY: Past Surgical History:  Procedure Laterality Date  . GALLBLADDER SURGERY  2012  . NASAL SINUS SURGERY    . NASAL SINUS SURGERY      SOCIAL HISTORY: Social History   Tobacco Use  . Smoking status: Never Smoker  . Smokeless tobacco: Never Used  Substance Use Topics  . Alcohol use: Yes    Alcohol/week: 0.0 standard drinks  . Drug use: No    FAMILY HISTORY: Family History  Problem Relation Age of Onset  . Diabetes Other   . Hypertension Other   . Breast cancer Paternal Aunt 34  . Depression Mother    ROS: Review of Systems  Respiratory: Negative for shortness of  breath.   Cardiovascular: Negative for chest pain.  Gastrointestinal: Negative for nausea and vomiting.  Musculoskeletal:       Negative for muscle weakness.  Psychiatric/Behavioral: Positive for depression (emotional eating). Negative for suicidal ideas.       Negative for homicidal ideas.   PHYSICAL EXAM: Blood pressure (!) 147/89, pulse 89, temperature 98.3 F (36.8 C), height 5\' 4"  (1.626 m), weight 225 lb (102.1 kg), SpO2 97 %. Body mass index is 38.62 kg/m. Physical Exam Vitals signs reviewed.  Constitutional:      Appearance: Normal appearance. She is obese.  Cardiovascular:     Rate and Rhythm: Normal rate.     Pulses: Normal pulses.  Pulmonary:     Effort: Pulmonary effort is normal.     Breath sounds: Normal breath sounds.  Musculoskeletal: Normal range of motion.  Skin:    General: Skin is warm and dry.  Neurological:     Mental Status: She is alert and oriented to person, place, and time.  Psychiatric:        Behavior: Behavior normal.   RECENT LABS AND TESTS: BMET    Component Value Date/Time   NA 137 02/28/2019 0945   NA 139 10/04/2011 0843   K 4.7 02/28/2019 0945   K 3.8 10/04/2011 0843   CL 102 02/28/2019 0945   CL 106 10/04/2011 0843   CO2 21 02/28/2019 0945   CO2 28 10/04/2011 0843   GLUCOSE 95 02/28/2019 0945   GLUCOSE 95 11/15/2017 1409   GLUCOSE 91 10/04/2011 0843   BUN 12 02/28/2019 0945   BUN 9 10/04/2011 0843   CREATININE 0.73 02/28/2019 0945   CREATININE 0.82 10/04/2011 0843   CALCIUM 9.0 02/28/2019 0945   CALCIUM 9.1 10/04/2011 0843   GFRNONAA 98 02/28/2019 0945   GFRNONAA >60 10/04/2011 0843   GFRAA 113 02/28/2019 0945   GFRAA >60 10/04/2011 0843   Lab Results  Component Value Date   HGBA1C 5.0 02/28/2019   Lab Results  Component Value Date   INSULIN 11.1 02/28/2019   CBC    Component Value Date/Time   WBC 6.4 02/28/2019 0945   WBC 6.3 11/15/2017 1409   RBC 4.19 02/28/2019 0945   RBC 3.91 11/15/2017 1409   HGB 14.1  02/28/2019 0945   HCT 41.8 02/28/2019 0945  PLT 220 02/28/2019 0945   MCV 100 (H) 02/28/2019 0945   MCV 95 10/04/2011 0843   MCH 33.7 (H) 02/28/2019 0945   MCH 32.8 10/04/2011 0843   MCHC 33.7 02/28/2019 0945   MCHC 34.8 11/15/2017 1409   RDW 12.4 02/28/2019 0945   RDW 13.2 10/04/2011 0843   LYMPHSABS 2.1 02/28/2019 0945   MONOABS 0.5 11/15/2017 1409   EOSABS 0.1 02/28/2019 0945   BASOSABS 0.1 02/28/2019 0945   Iron/TIBC/Ferritin/ %Sat No results found for: IRON, TIBC, FERRITIN, IRONPCTSAT Lipid Panel     Component Value Date/Time   CHOL 183 02/28/2019 0945   TRIG 134 02/28/2019 0945   HDL 71 02/28/2019 0945   CHOLHDL 3 11/15/2017 1409   VLDL 44.6 (H) 11/15/2017 1409   LDLCALC 89 02/28/2019 0945   LDLDIRECT 89.0 11/15/2017 1409   Hepatic Function Panel     Component Value Date/Time   PROT 7.4 02/28/2019 0945   PROT 8.0 10/04/2011 0843   ALBUMIN 4.6 02/28/2019 0945   ALBUMIN 3.7 10/04/2011 0843   AST 23 02/28/2019 0945   AST 20 10/04/2011 0843   ALT 23 02/28/2019 0945   ALT 27 10/04/2011 0843   ALKPHOS 64 02/28/2019 0945   ALKPHOS 83 10/04/2011 0843   BILITOT 0.4 02/28/2019 0945   BILITOT 0.5 10/04/2011 0843   BILIDIR 0.1 02/03/2010 1134      Component Value Date/Time   TSH 1.940 02/28/2019 0945   TSH 1.07 11/15/2017 1409   TSH 1.71 08/10/2016 1252   Results for ARIUS, PENISTON (MRN [id number]) as of 04/03/2019 13:37  Ref. Range 02/28/2019 09:45  Vitamin D, 25-Hydroxy Latest Ref Range: 30.0 - 100.0 ng/mL 36.7   OBESITY BEHAVIORAL INTERVENTION VISIT  Today's visit was #3  Starting weight: 226 lbs Starting date: 02/28/2019 Today's weight: 225 lbs Today's date: 04/03/2019 Total lbs lost to date: 1    04/03/2019  Height 5\' 4"  (1.626 m)  Weight 225 lb (102.1 kg)  BMI (Calculated) 38.6  BLOOD PRESSURE - SYSTOLIC [id number]  BLOOD PRESSURE - DIASTOLIC 89   Body Fat % [id number] %  Total Body Water (lbs) 89.2 lbs   ASK: We discussed the diagnosis of obesity with  Amber Frost today and Amber Frost agreed to give [place] permission to discuss obesity behavioral modification therapy today.  ASSESS: Amber Frost has the diagnosis of obesity and her BMI today is 38.6. Amber Frost is in the action stage of change.   ADVISE: Amber Frost was educated on the multiple health risks of obesity as well as the benefit of weight loss to improve her health. She was advised of the need for long term treatment and the importance of lifestyle modifications to improve her current health and to decrease her risk of future health problems.  AGREE: Multiple dietary modification options and treatment options were discussed and  Amber Frost agreed to follow the recommendations documented in the above note.  ARRANGE: Amber Frost was educated on the importance of frequent visits to treat obesity as outlined per CMS and USPSTF guidelines and agreed to schedule her next follow up appointment today.  Migdalia Dk, am acting as Location manager for [employer], DO  I have reviewed the above documentation for accuracy and completeness, and I agree with the above. -Jearld Lesch, DO

## 2019-04-04 ENCOUNTER — Encounter (INDEPENDENT_AMBULATORY_CARE_PROVIDER_SITE_OTHER): Payer: Self-pay | Admitting: Bariatrics

## 2019-04-04 ENCOUNTER — Other Ambulatory Visit (INDEPENDENT_AMBULATORY_CARE_PROVIDER_SITE_OTHER): Payer: Self-pay | Admitting: Family Medicine

## 2019-04-04 DIAGNOSIS — E559 Vitamin D deficiency, unspecified: Secondary | ICD-10-CM

## 2019-04-06 ENCOUNTER — Other Ambulatory Visit (INDEPENDENT_AMBULATORY_CARE_PROVIDER_SITE_OTHER): Payer: Self-pay | Admitting: Family Medicine

## 2019-04-06 DIAGNOSIS — F3289 Other specified depressive episodes: Secondary | ICD-10-CM

## 2019-04-09 ENCOUNTER — Other Ambulatory Visit (INDEPENDENT_AMBULATORY_CARE_PROVIDER_SITE_OTHER): Payer: Self-pay | Admitting: Bariatrics

## 2019-04-09 ENCOUNTER — Other Ambulatory Visit (INDEPENDENT_AMBULATORY_CARE_PROVIDER_SITE_OTHER): Payer: Self-pay

## 2019-04-09 DIAGNOSIS — I10 Essential (primary) hypertension: Secondary | ICD-10-CM

## 2019-04-09 MED ORDER — CHLORTHALIDONE 25 MG PO TABS: 25.0000 mg | ORAL_TABLET | Freq: Every day | ORAL | 0 refills | Status: DC

## 2019-04-24 ENCOUNTER — Ambulatory Visit (INDEPENDENT_AMBULATORY_CARE_PROVIDER_SITE_OTHER): Payer: BC Managed Care – PPO | Admitting: Bariatrics

## 2019-04-26 ENCOUNTER — Other Ambulatory Visit (INDEPENDENT_AMBULATORY_CARE_PROVIDER_SITE_OTHER): Payer: Self-pay | Admitting: Bariatrics

## 2019-04-26 DIAGNOSIS — E559 Vitamin D deficiency, unspecified: Secondary | ICD-10-CM

## 2019-05-08 ENCOUNTER — Telehealth: Payer: Self-pay | Admitting: Family Medicine

## 2019-05-08 ENCOUNTER — Other Ambulatory Visit: Payer: Self-pay

## 2019-05-08 ENCOUNTER — Encounter (INDEPENDENT_AMBULATORY_CARE_PROVIDER_SITE_OTHER): Payer: Self-pay | Admitting: Family Medicine

## 2019-05-08 ENCOUNTER — Ambulatory Visit (INDEPENDENT_AMBULATORY_CARE_PROVIDER_SITE_OTHER): Payer: BC Managed Care – PPO | Admitting: Family Medicine

## 2019-05-08 VITALS — BP 125/76 | HR 99 | Temp 97.7°F | Ht 64.0 in | Wt 229.0 lb

## 2019-05-08 DIAGNOSIS — Z9189 Other specified personal risk factors, not elsewhere classified: Secondary | ICD-10-CM | POA: Diagnosis not present

## 2019-05-08 DIAGNOSIS — E8881 Metabolic syndrome: Secondary | ICD-10-CM

## 2019-05-08 DIAGNOSIS — Z6839 Body mass index (BMI) 39.0-39.9, adult: Secondary | ICD-10-CM

## 2019-05-08 DIAGNOSIS — I10 Essential (primary) hypertension: Secondary | ICD-10-CM | POA: Diagnosis not present

## 2019-05-08 DIAGNOSIS — F3289 Other specified depressive episodes: Secondary | ICD-10-CM | POA: Diagnosis not present

## 2019-05-08 DIAGNOSIS — E559 Vitamin D deficiency, unspecified: Secondary | ICD-10-CM

## 2019-05-08 MED ORDER — BD PEN NEEDLE NANO 2ND GEN 32G X 4 MM MISC: 1.0000 | Freq: Two times a day (BID) | 0 refills | Status: DC

## 2019-05-08 MED ORDER — SAXENDA 18 MG/3ML ~~LOC~~ SOPN: 3.0000 mg | PEN_INJECTOR | Freq: Every day | SUBCUTANEOUS | 0 refills | Status: DC

## 2019-05-08 MED ORDER — VITAMIN D (ERGOCALCIFEROL) 1.25 MG (50000 UNIT) PO CAPS: 50000.0000 [IU] | ORAL_CAPSULE | ORAL | 0 refills | Status: DC

## 2019-05-08 NOTE — Telephone Encounter (Signed)
Please advise 

## 2019-05-08 NOTE — Telephone Encounter (Signed)
Called about message sent:  Appointment Request From: Amber Frost    With Provider: Arnette Norris, MD [[facility]]    Preferred Date Range: 05/08/2019 - 05/29/2019    Preferred Times: Any Time    Reason for visit: Annual Physical    Comments:  ANNUAL PHYSICAL   Had to leave a message

## 2019-05-09 ENCOUNTER — Encounter (INDEPENDENT_AMBULATORY_CARE_PROVIDER_SITE_OTHER): Payer: Self-pay

## 2019-05-09 ENCOUNTER — Telehealth (INDEPENDENT_AMBULATORY_CARE_PROVIDER_SITE_OTHER): Payer: Self-pay | Admitting: Family Medicine

## 2019-05-09 ENCOUNTER — Encounter (INDEPENDENT_AMBULATORY_CARE_PROVIDER_SITE_OTHER): Payer: Self-pay | Admitting: Family Medicine

## 2019-05-09 MED ORDER — FLUOXETINE HCL 20 MG PO TABS: 20.0000 mg | ORAL_TABLET | Freq: Every day | ORAL | 0 refills | Status: DC

## 2019-05-09 NOTE — Progress Notes (Signed)
Office: [phone]  /  Fax: [phone]   HPI:   Chief Complaint: OBESITY Amber Frost is here to discuss her progress with her obesity treatment plan. She is on the Category 3 plan and is following her eating plan approximately 30 % of the time. She states she is exercising 0 minutes 0 times per week. 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 She is very anxious and has poor sleep.  Her weight is 229 lb (103.9 kg) today and has gained 4 lbs since her last visit. She has lost 0 lbs since starting treatment with [place].  Hypertension Amber Frost is a 48 y.o. female with hypertension. Amber Frost's blood pressure is at goal with a reading of 125/76. She denies chest pain. She is working on weight loss to help control her blood pressure with the goal of decreasing her risk of heart attack and stroke.   Vitamin D Deficiency Amber Frost has a diagnosis of vitamin D deficiency. She is currently taking prescription Vit D and denies nausea, vomiting or muscle weakness.  At risk for osteopenia and osteoporosis Amber Frost is at higher risk of osteopenia and osteoporosis due to vitamin D deficiency.   Insulin Resistance Amber Frost has a diagnosis of insulin resistance based on her elevated fasting insulin level >5. Although Amber Frost's blood glucose readings are still under good control, insulin resistance puts her at greater risk of metabolic syndrome and diabetes. She is not taking metformin currently and continues to work on diet and exercise to decrease risk of diabetes.  Depression with Emotional Eating Behaviors Amber Frost is struggling with emotional eating and using food for comfort to the extent that it is negatively impacting her health. She often snacks when she is not hungry. Amber Frost sometimes feels she is out of control and then feels guilty that she made poor food choices. She has been working on behavior modification techniques to help reduce her emotional  eating and has been somewhat successful. She shows no sign of suicidal or homicidal ideations.  Depression screen Amber Frost  Decreased Interest 1 1 0 0 0  Down, Depressed, Hopeless 2 1 0 0 0  PHQ - 2 Score 3 2 0 0 0  Altered sleeping 2 3 - - -  Tired, decreased energy 2 2 - - -  Change in appetite 3 0 - - -  Feeling bad or failure about yourself  0 0 - - -  Trouble concentrating 3 3 - - -  Moving slowly or fidgety/restless 2 0 - - -  Suicidal thoughts 0 0 - - -  PHQ-9 Score 15 10 - - -  Difficult doing work/chores Very difficult Somewhat difficult - - -    ASSESSMENT AND PLAN:  Essential hypertension  Vitamin D deficiency - Plan: Vitamin D, Ergocalciferol, (DRISDOL) 1.25 MG (50000 UT) CAPS capsule  Insulin resistance - Plan: Liraglutide -Weight Management (SAXENDA) 18 MG/3ML SOPN, Insulin Pen Needle (BD PEN NEEDLE NANO 2ND GEN) 32G X 4 MM MISC  Other depression,emotional eating  At risk for osteoporosis  Class 2 severe obesity with serious comorbidity and body mass index (BMI) of 39.0 to 39.9 in adult, unspecified obesity type (HCC)  PLAN:  Hypertension We discussed sodium restriction, working on healthy weight loss, and a regular exercise program as the means to achieve improved blood pressure control. Mirriam agreed with this plan and agreed to follow up as directed. We will continue to monitor  her blood pressure as well as her progress with the above lifestyle modifications. Teniesha agrees to continue her medications and will watch for signs of hypotension as she continues her lifestyle modifications. Tremya agrees to follow up with our clinic in 2 weeks.  Vitamin D Deficiency Amber Frost was informed that low vitamin D levels contributes to fatigue and are associated with obesity, breast, and colon cancer. Sheresa agrees to continue taking prescription Vit D 50,000 IU every week #4 and we will refill for 1 month. She will follow up for  routine testing of vitamin D, at least 2-3 times per year. She was informed of the risk of over-replacement of vitamin D and agrees to not increase her dose unless she discusses this with [place] first. Deeanne agrees to follow up with our clinic in 2 weeks.  At risk for osteopenia and osteoporosis Amber Frost was given extended (15 minutes) osteoporosis prevention counseling today. Adelay is at risk for osteopenia and osteoporsis due to her vitamin D deficiency. She was encouraged to take her vitamin D and follow her higher calcium diet and increase strengthening exercise to help strengthen her bones and decrease her risk of osteopenia and osteoporosis.  Insulin Resistance Amber Frost will continue to work on weight loss, exercise, and decreasing simple carbohydrates in her diet to help decrease the risk of diabetes. We dicussed metformin including benefits and risks. She was informed that eating too many simple carbohydrates or too many calories at one sitting increases the likelihood of GI side effects. Amber Frost agrees to start Saxenda 3 mg SubQ daily #5 pens with no refills, and nano needles #100 with no refills. Amber Frost agrees to follow up with our clinic in 2 weeks as directed to monitor her progress.  Depression with Emotional Eating Behaviors We discussed behavior modification techniques today to help Amber Frost deal with her emotional eating behaviors. We will continue to monitor, and we discussed self-care. Amber Frost agrees to continue taking Wellbutrin, and she agrees to follow up with our clinic in 2 weeks.  Obesity Amber Frost is currently in the action stage of change. As such, her goal is to continue with weight loss efforts She has agreed to follow the Category 3 plan Naema has been instructed to work up to a goal of 150 minutes of combined cardio and strengthening exercise per week or as tolerated for weight loss and overall health benefits. We discussed the following Behavioral Modification Strategies today:  increasing lean protein intake, decreasing simple carbohydrates, increasing vegetables, increase H20 intake, and work on meal planning and easy cooking plans   Dariah has agreed to follow up with our clinic in 2 weeks. She was informed of the importance of frequent follow up visits to maximize her success with intensive lifestyle modifications for her multiple health conditions.  ALLERGIES: Allergies  Allergen Reactions  . Sulfa Antibiotics Rash    MEDICATIONS: Current Outpatient Medications on File Prior to Visit  Medication Sig Dispense Refill  . ALPRAZolam (XANAX) 1 MG tablet TAKE 1 TABLET (1 MG TOTAL) BY MOUTH 3 (THREE) TIMES DAILY AS NEEDED. 60 tablet 1  . Cetirizine HCl (ZYRTEC ALLERGY PO) Take by mouth.    . chlorthalidone (HYGROTON) 25 MG tablet Take 1 tablet (25 mg total) by mouth daily. 30 tablet 0  . clobetasol cream (TEMOVATE) [id number] % Apply 1 application topically at bedtime. 60 g 2  . Fluticasone Propionate (FLONASE NA) Place into the nose.    Marland Kitchen lisinopril (ZESTRIL) 30 MG tablet TAKE 1 TABLET BY MOUTH EVERY DAY 90  tablet 1  . traZODone (DESYREL) 50 MG tablet TAKE 0.5-1 TABLETS (25-50 MG TOTAL) BY MOUTH AT BEDTIME AS NEEDED FOR SLEEP. 90 tablet 1   No current facility-administered medications on file prior to visit.     PAST MEDICAL HISTORY: Past Medical History:  Diagnosis Date  . Allergies   . Anxiety   . Back pain   . Depression   . Depression   . Gallbladder problem   . Heartburn   . Hypertension   . Irritable bowel syndrome (IBS)   . Joint pain   . Sinusitis     PAST SURGICAL HISTORY: Past Surgical History:  Procedure Laterality Date  . GALLBLADDER SURGERY  2012  . NASAL SINUS SURGERY    . NASAL SINUS SURGERY      SOCIAL HISTORY: Social History   Tobacco Use  . Smoking status: Never Smoker  . Smokeless tobacco: Never Used  Substance Use Topics  . Alcohol use: Yes    Alcohol/week: 0.0 standard drinks  . Drug use: No    FAMILY  HISTORY: Family History  Problem Relation Age of Onset  . Diabetes Other   . Hypertension Other   . Breast cancer Paternal Aunt 78  . Depression Mother     ROS: Review of Systems  Constitutional: Negative for weight loss.  Cardiovascular: Negative for chest pain.  Gastrointestinal: Negative for nausea and vomiting.  Musculoskeletal:       Negative muscle weakness  Psychiatric/Behavioral: Positive for depression. Negative for suicidal ideas.    PHYSICAL EXAM: Blood pressure 125/76, pulse 99, temperature 97.7 F (36.5 C), temperature source Oral, height 5\' 4"  (1.626 m), weight 229 lb (103.9 kg), SpO2 94 %. Body mass index is 39.31 kg/m. Physical Exam Vitals signs reviewed.  Constitutional:      Appearance: Normal appearance. She is obese.  Cardiovascular:     Rate and Rhythm: Normal rate.     Pulses: Normal pulses.  Pulmonary:     Effort: Pulmonary effort is normal.     Breath sounds: Normal breath sounds.  Musculoskeletal: Normal range of motion.  Skin:    General: Skin is warm and dry.  Neurological:     Mental Status: She is alert and oriented to person, place, and time.  Psychiatric:        Mood and Affect: Mood normal.        Behavior: Behavior normal.     RECENT LABS AND TESTS: BMET    Component Value Date/Time   NA 137 02/28/2019 0945   NA 139 10/04/2011 0843   K 4.7 02/28/2019 0945   K 3.8 10/04/2011 0843   CL 102 02/28/2019 0945   CL 106 10/04/2011 0843   CO2 21 02/28/2019 0945   CO2 28 10/04/2011 0843   GLUCOSE 95 02/28/2019 0945   GLUCOSE 95 11/15/2017 1409   GLUCOSE 91 10/04/2011 0843   BUN 12 02/28/2019 0945   BUN 9 10/04/2011 0843   CREATININE 0.73 02/28/2019 0945   CREATININE 0.82 10/04/2011 0843   CALCIUM 9.0 02/28/2019 0945   CALCIUM 9.1 10/04/2011 0843   GFRNONAA 98 02/28/2019 0945   GFRNONAA >60 10/04/2011 0843   GFRAA 113 02/28/2019 0945   GFRAA >60 10/04/2011 0843   Lab Results  Component Value Date   HGBA1C 5.0 02/28/2019    Lab Results  Component Value Date   INSULIN 11.1 02/28/2019   CBC    Component Value Date/Time   WBC 6.4 02/28/2019 0945   WBC 6.3 11/15/2017 1409  RBC 4.19 02/28/2019 0945   RBC 3.91 11/15/2017 1409   HGB 14.1 02/28/2019 0945   HCT 41.8 02/28/2019 0945   PLT 220 02/28/2019 0945   MCV 100 (H) 02/28/2019 0945   MCV 95 10/04/2011 0843   MCH 33.7 (H) 02/28/2019 0945   MCH 32.8 10/04/2011 0843   MCHC 33.7 02/28/2019 0945   MCHC 34.8 11/15/2017 1409   RDW 12.4 02/28/2019 0945   RDW 13.2 10/04/2011 0843   LYMPHSABS 2.1 02/28/2019 0945   MONOABS 0.5 11/15/2017 1409   EOSABS 0.1 02/28/2019 0945   BASOSABS 0.1 02/28/2019 0945   Iron/TIBC/Ferritin/ %Sat No results found for: IRON, TIBC, FERRITIN, IRONPCTSAT Lipid Panel     Component Value Date/Time   CHOL 183 02/28/2019 0945   TRIG 134 02/28/2019 0945   HDL 71 02/28/2019 0945   CHOLHDL 3 11/15/2017 1409   VLDL 44.6 (H) 11/15/2017 1409   LDLCALC 89 02/28/2019 0945   LDLDIRECT 89.0 11/15/2017 1409   Hepatic Function Panel     Component Value Date/Time   PROT 7.4 02/28/2019 0945   PROT 8.0 10/04/2011 0843   ALBUMIN 4.6 02/28/2019 0945   ALBUMIN 3.7 10/04/2011 0843   AST 23 02/28/2019 0945   AST 20 10/04/2011 0843   ALT 23 02/28/2019 0945   ALT 27 10/04/2011 0843   ALKPHOS 64 02/28/2019 0945   ALKPHOS 83 10/04/2011 0843   BILITOT 0.4 02/28/2019 0945   BILITOT 0.5 10/04/2011 0843   BILIDIR 0.1 02/03/2010 1134      Component Value Date/Time   TSH 1.940 02/28/2019 0945   TSH 1.07 11/15/2017 1409   TSH 1.71 08/10/2016 1252      OBESITY BEHAVIORAL INTERVENTION VISIT  Today's visit was # 4   Starting weight: 226 lbs Starting date: 02/28/2019 Today's weight : 229 lbs Today's date: 05/08/2019 Total lbs lost to date: 0    ASK: We discussed the diagnosis of obesity with Alberteen Sam today and Momo agreed to give [place] permission to discuss obesity behavioral modification therapy today.  ASSESS: Brittane has  the diagnosis of obesity and her BMI today is 39.29 Jaana is in the action stage of change   ADVISE: Mila was educated on the multiple health risks of obesity as well as the benefit of weight loss to improve her health. She was advised of the need for long term treatment and the importance of lifestyle modifications to improve her current health and to decrease her risk of future health problems.  AGREE: Multiple dietary modification options and treatment options were discussed and  Olean agreed to follow the recommendations documented in the above note.  ARRANGE: Maraiah was educated on the importance of frequent visits to treat obesity as outlined per CMS and USPSTF guidelines and agreed to schedule her next follow up appointment today.  Wilhemena Durie, am acting as transcriptionist for Briscoe Deutscher, DO  I have reviewed the above documentation for accuracy and completeness, and I agree with the above. Briscoe Deutscher, DO

## 2019-05-09 NOTE — Telephone Encounter (Signed)
Lm for pt on identified voicemail,  advising that we are working on prior authorization and this can take 7-10 days for an answer from her insurance company.

## 2019-05-10 ENCOUNTER — Encounter (INDEPENDENT_AMBULATORY_CARE_PROVIDER_SITE_OTHER): Payer: Self-pay | Admitting: Family Medicine

## 2019-05-22 ENCOUNTER — Encounter: Payer: BC Managed Care – PPO | Admitting: Family Medicine

## 2019-05-29 ENCOUNTER — Encounter (INDEPENDENT_AMBULATORY_CARE_PROVIDER_SITE_OTHER): Payer: Self-pay | Admitting: Family Medicine

## 2019-05-29 ENCOUNTER — Other Ambulatory Visit: Payer: Self-pay

## 2019-05-29 ENCOUNTER — Ambulatory Visit (INDEPENDENT_AMBULATORY_CARE_PROVIDER_SITE_OTHER): Payer: BC Managed Care – PPO | Admitting: Family Medicine

## 2019-05-29 VITALS — BP 126/78 | HR 86 | Temp 98.1°F | Ht 64.0 in | Wt 223.0 lb

## 2019-05-29 DIAGNOSIS — E8881 Metabolic syndrome: Secondary | ICD-10-CM | POA: Diagnosis not present

## 2019-05-29 DIAGNOSIS — I10 Essential (primary) hypertension: Secondary | ICD-10-CM | POA: Diagnosis not present

## 2019-05-29 DIAGNOSIS — F3289 Other specified depressive episodes: Secondary | ICD-10-CM | POA: Diagnosis not present

## 2019-05-29 DIAGNOSIS — Z9189 Other specified personal risk factors, not elsewhere classified: Secondary | ICD-10-CM

## 2019-05-29 DIAGNOSIS — G4709 Other insomnia: Secondary | ICD-10-CM

## 2019-05-29 DIAGNOSIS — E559 Vitamin D deficiency, unspecified: Secondary | ICD-10-CM | POA: Diagnosis not present

## 2019-05-29 DIAGNOSIS — E88819 Insulin resistance, unspecified: Secondary | ICD-10-CM

## 2019-05-29 DIAGNOSIS — F329 Major depressive disorder, single episode, unspecified: Secondary | ICD-10-CM | POA: Insufficient documentation

## 2019-05-29 DIAGNOSIS — F32A Depression, unspecified: Secondary | ICD-10-CM | POA: Insufficient documentation

## 2019-05-29 DIAGNOSIS — Z6838 Body mass index (BMI) 38.0-38.9, adult: Secondary | ICD-10-CM

## 2019-05-29 MED ORDER — CHLORTHALIDONE 25 MG PO TABS: 25.0000 mg | ORAL_TABLET | Freq: Every day | ORAL | 0 refills | Status: DC

## 2019-05-29 MED ORDER — SAXENDA 18 MG/3ML ~~LOC~~ SOPN: 3.0000 mg | PEN_INJECTOR | Freq: Every day | SUBCUTANEOUS | 0 refills | Status: DC

## 2019-05-29 MED ORDER — VITAMIN D (ERGOCALCIFEROL) 1.25 MG (50000 UNIT) PO CAPS: 50000.0000 [IU] | ORAL_CAPSULE | ORAL | 0 refills | Status: DC

## 2019-05-30 ENCOUNTER — Telehealth: Payer: BC Managed Care – PPO | Admitting: Family Medicine

## 2019-06-04 ENCOUNTER — Other Ambulatory Visit: Payer: Self-pay

## 2019-06-04 DIAGNOSIS — I10 Essential (primary) hypertension: Secondary | ICD-10-CM

## 2019-06-04 MED ORDER — LISINOPRIL 30 MG PO TABS: 30.0000 mg | ORAL_TABLET | Freq: Every day | ORAL | 1 refills | Status: DC

## 2019-06-04 NOTE — Progress Notes (Addendum)
Office: [phone]  /  Fax: [phone]   HPI:  Chief Complaint: OBESITY Amber Frost is here to discuss her progress with her obesity treatment plan. She is on the Category 3 plan and states she is following her eating plan approximately 60 % of the time. She states she is walking for 15 minutes 5 times per week.  Amber Frost had a fun birthday with her girlfriends. She started Amber Frost after her last visit, and she is up to 2.4 mg SubQ daily.  Today's visit was # 5  Starting weight: 226 lbs Starting date: 02/28/2019 Today's weight : 223 lbs Today's date: 05/29/2019 Total lbs lost to date: 3 Total lbs lost since last in-office visit: 6  Hypertension Amber Frost has a diagnosis of hypertension. She is taking chlorthalidone and lisinopril.  Vitamin D Deficiency Amber Frost has a diagnosis of vitamin D deficiency. She is tolerating prescription Vit D.  Insulin Resistance Amber Frost has a diagnosis of insulin resistance. She is tolerating Saxenda, she notes mild nausea at first.  At risk for diabetes Amber Frost is at higher than average risk for developing diabetes due to her obesity and insulin resistance.   Depression with Emotional Eating Behaviors Amber Frost struggles with emotional eating. She has restarted Wellbutrin.  Insomnia Amber Frost complains of insomnia. She is taking Trazodone and notes that it helps.  ASSESSMENT AND PLAN:  Essential hypertension - Plan: chlorthalidone (HYGROTON) 25 MG tablet  Vitamin D deficiency - Plan: Vitamin D, Ergocalciferol, (DRISDOL) 1.25 MG (50000 UT) CAPS capsule  Insulin resistance - Plan: Liraglutide -Weight Management (SAXENDA) 18 MG/3ML SOPN  Emotional eating  Other insomnia  At risk for diabetes mellitus  Class 2 severe obesity with serious comorbidity and body mass index (BMI) of 38.0 to 38.9 in adult, unspecified obesity type ([place])  PLAN:  Hypertension Amber Frost is working on healthy weight loss and exercise to improve blood pressure control. We will  watch for signs of hypotension as she continues her lifestyle modifications. Amber Frost agrees to continue taking chlorthalidone 25 mg PO daily #30 and we will refill for 1 month. Amber Frost agrees to follow up with Amber Frost as directed.  Vitamin D Deficiency Low Vitamin D level contributes to fatigue and are associated with obesity, breast, and colon cancer. Chau agrees to continue taking prescription Vitamin D 50,000 IU every week #4 and we will refill for 1 month. She will follow up for routine testing of vitamin D, at least 2-3 times per year to avoid over-replacement. Amber Frost agrees to follow up with Amber Frost as directed.  Insulin Resistance Amber Frost will continue to work on weight loss, exercise, and decreasing simple carbohydrates to help decrease the risk of diabetes. Amber Frost agrees to continue Saxenda 3 mg SubQ daily #5 pens and we will refill for 1 month. Amber Frost agrees to follow-up with Amber Frost as directed to closely monitor her progress.  Diabetes risk counseling (~15 min) Amber Frost is a 48 y.o. female and has risk factors for diabetes including obesity and insulin resistance. We discussed intensive lifestyle modifications today with an emphasis on weight loss as well as increasing exercise and decreasing simple carbohydrates in her diet.  Emotional Eating Behaviors (Other Depression) Behavior modification techniques were discussed today to help Amber Frost deal with her emotional/non-hunger eating behaviors. Amber Frost agrees to continue her medications and we will continue to follow and monitor her progress.  Insomnia The problem of recurrent insomnia was discussed. Avoidance of caffeine was strongly encouraged and sleep hygiene issues were reviewed. Amber Frost agrees to continue her medications and we will continue to  monitor her progress.  Obesity Amber Frost is currently in the action stage of change. As such, her goal is to continue with weight loss efforts. She has agreed to follow the Category 3 plan. Amber Frost has been  instructed to work up to a goal of 150 minutes of combined cardio and strengthening exercise per week for weight loss and overall health benefits. We discussed the following Behavioral Modification Strategies today: increasing water intake, meal planning and cooking strategies and celebration eating strategies.   Amber Frost has agreed to follow-up with our clinic in 2 weeks. She was informed of the importance of frequent follow-up visits to maximize her success with intensive lifestyle modifications for her multiple health conditions.  ALLERGIES: Allergies  Allergen Reactions  . Sulfa Antibiotics Rash    MEDICATIONS: Current Outpatient Medications on File Prior to Visit  Medication Sig Dispense Refill  . ALPRAZolam (XANAX) 1 MG tablet TAKE 1 TABLET (1 MG TOTAL) BY MOUTH 3 (THREE) TIMES DAILY AS NEEDED. 60 tablet 1  . Cetirizine HCl (ZYRTEC ALLERGY PO) Take by mouth.    . clobetasol cream (TEMOVATE) [id number] % Apply 1 application topically at bedtime. 60 g 2  . FLUoxetine (PROZAC) 20 MG tablet Take 1 tablet (20 mg total) by mouth daily. 30 tablet 0  . Fluticasone Propionate (FLONASE NA) Place into the nose.    . Insulin Pen Needle (BD PEN NEEDLE NANO 2ND GEN) 32G X 4 MM MISC 1 Package by Does not apply route 2 (two) times daily. 100 each 0  . traZODone (DESYREL) 50 MG tablet TAKE 0.5-1 TABLETS (25-50 MG TOTAL) BY MOUTH AT BEDTIME AS NEEDED FOR SLEEP. 90 tablet 1   No current facility-administered medications on file prior to visit.    PAST MEDICAL HISTORY: Past Medical History:  Diagnosis Date  . Allergies   . Anxiety   . Back pain   . Depression   . Depression   . Gallbladder problem   . Heartburn   . Hypertension   . Irritable bowel syndrome (IBS)   . Joint pain   . Sinusitis     PAST SURGICAL HISTORY: Past Surgical History:  Procedure Laterality Date  . GALLBLADDER SURGERY  2012  . NASAL SINUS SURGERY    . NASAL SINUS SURGERY      SOCIAL HISTORY: Social History    Tobacco Use  . Smoking status: Never Smoker  . Smokeless tobacco: Never Used  Substance Use Topics  . Alcohol use: Yes    Alcohol/week: 0.0 standard drinks  . Drug use: No    FAMILY HISTORY: Family History  Problem Relation Age of Onset  . Diabetes Other   . Hypertension Other   . Breast cancer Paternal Aunt 52  . Depression Mother     ROS: Review of Systems  Constitutional: Positive for weight loss.  Gastrointestinal: Positive for nausea.  Psychiatric/Behavioral: The patient has insomnia.     PHYSICAL EXAM: Blood pressure 126/78, pulse 86, temperature 98.1 F (36.7 C), temperature source Oral, height 5\' 4"  (1.626 m), weight 223 lb (101.2 kg), SpO2 93 %. Body mass index is 38.28 kg/m.  General: Cooperative, alert, well developed, in no acute distress. HEENT: Conjunctivae and lids unremarkable. Neck: No thyromegaly.  Cardiovascular: Regular rhythm.  Lungs: Normal work of breathing. Extremities: No edema.  Neurologic: No focal deficits.   RECENT LABS AND TESTS: BMET    Component Value Date/Time   NA 137 02/28/2019 0945   NA 139 10/04/2011 0843   K 4.7 02/28/2019 0945  K 3.8 10/04/2011 0843   CL 102 02/28/2019 0945   CL 106 10/04/2011 0843   CO2 21 02/28/2019 0945   CO2 28 10/04/2011 0843   GLUCOSE 95 02/28/2019 0945   GLUCOSE 95 11/15/2017 1409   GLUCOSE 91 10/04/2011 0843   BUN 12 02/28/2019 0945   BUN 9 10/04/2011 0843   CREATININE 0.73 02/28/2019 0945   CREATININE 0.82 10/04/2011 0843   CALCIUM 9.0 02/28/2019 0945   CALCIUM 9.1 10/04/2011 0843   GFRNONAA 98 02/28/2019 0945   GFRNONAA >60 10/04/2011 0843   GFRAA 113 02/28/2019 0945   GFRAA >60 10/04/2011 0843   Lab Results  Component Value Date   HGBA1C 5.0 02/28/2019   Lab Results  Component Value Date   INSULIN 11.1 02/28/2019   CBC    Component Value Date/Time   WBC 6.4 02/28/2019 0945   WBC 6.3 11/15/2017 1409   RBC 4.19 02/28/2019 0945   RBC 3.91 11/15/2017 1409   HGB 14.1  02/28/2019 0945   HCT 41.8 02/28/2019 0945   PLT 220 02/28/2019 0945   MCV 100 (H) 02/28/2019 0945   MCV 95 10/04/2011 0843   MCH 33.7 (H) 02/28/2019 0945   MCH 32.8 10/04/2011 0843   MCHC 33.7 02/28/2019 0945   MCHC 34.8 11/15/2017 1409   RDW 12.4 02/28/2019 0945   RDW 13.2 10/04/2011 0843   LYMPHSABS 2.1 02/28/2019 0945   MONOABS 0.5 11/15/2017 1409   EOSABS 0.1 02/28/2019 0945   BASOSABS 0.1 02/28/2019 0945   Iron/TIBC/Ferritin/ %Sat No results found for: IRON, TIBC, FERRITIN, IRONPCTSAT Lipid Panel     Component Value Date/Time   CHOL 183 02/28/2019 0945   TRIG 134 02/28/2019 0945   HDL 71 02/28/2019 0945   CHOLHDL 3 11/15/2017 1409   VLDL 44.6 (H) 11/15/2017 1409   LDLCALC 89 02/28/2019 0945   LDLDIRECT 89.0 11/15/2017 1409   Hepatic Function Panel     Component Value Date/Time   PROT 7.4 02/28/2019 0945   PROT 8.0 10/04/2011 0843   ALBUMIN 4.6 02/28/2019 0945   ALBUMIN 3.7 10/04/2011 0843   AST 23 02/28/2019 0945   AST 20 10/04/2011 0843   ALT 23 02/28/2019 0945   ALT 27 10/04/2011 0843   ALKPHOS 64 02/28/2019 0945   ALKPHOS 83 10/04/2011 0843   BILITOT 0.4 02/28/2019 0945   BILITOT 0.5 10/04/2011 0843   BILIDIR 0.1 02/03/2010 1134      Component Value Date/Time   TSH 1.940 02/28/2019 0945   TSH 1.07 11/15/2017 1409   TSH 1.71 08/10/2016 1252    I, Trixie Dredge, am acting as Location manager for Briscoe Deutscher, DO.   I have reviewed the above documentation for accuracy and completeness, and I agree with the above. Briscoe Deutscher, DO

## 2019-06-09 ENCOUNTER — Other Ambulatory Visit (INDEPENDENT_AMBULATORY_CARE_PROVIDER_SITE_OTHER): Payer: Self-pay | Admitting: Family Medicine

## 2019-06-11 MED ORDER — FLUOXETINE HCL 20 MG PO TABS: 20.0000 mg | ORAL_TABLET | Freq: Every day | ORAL | 0 refills | Status: DC

## 2019-06-20 ENCOUNTER — Other Ambulatory Visit: Payer: Self-pay

## 2019-06-20 MED ORDER — CLOBETASOL PROPIONATE 0.05 % EX CREA: 1.0000 "application " | TOPICAL_CREAM | Freq: Every day | CUTANEOUS | 2 refills | Status: AC

## 2019-06-27 ENCOUNTER — Other Ambulatory Visit: Payer: Self-pay

## 2019-06-27 ENCOUNTER — Ambulatory Visit (INDEPENDENT_AMBULATORY_CARE_PROVIDER_SITE_OTHER): Payer: BC Managed Care – PPO | Admitting: Family Medicine

## 2019-06-27 ENCOUNTER — Encounter (INDEPENDENT_AMBULATORY_CARE_PROVIDER_SITE_OTHER): Payer: Self-pay | Admitting: Family Medicine

## 2019-06-27 VITALS — BP 129/84 | HR 101 | Temp 98.7°F | Ht 64.0 in | Wt 217.0 lb

## 2019-06-27 DIAGNOSIS — E559 Vitamin D deficiency, unspecified: Secondary | ICD-10-CM | POA: Diagnosis not present

## 2019-06-27 DIAGNOSIS — E8881 Metabolic syndrome: Secondary | ICD-10-CM

## 2019-06-27 DIAGNOSIS — R718 Other abnormality of red blood cells: Secondary | ICD-10-CM

## 2019-06-27 DIAGNOSIS — E88819 Insulin resistance, unspecified: Secondary | ICD-10-CM

## 2019-06-27 DIAGNOSIS — R6889 Other general symptoms and signs: Secondary | ICD-10-CM

## 2019-06-27 DIAGNOSIS — I10 Essential (primary) hypertension: Secondary | ICD-10-CM | POA: Diagnosis not present

## 2019-06-27 DIAGNOSIS — F419 Anxiety disorder, unspecified: Secondary | ICD-10-CM

## 2019-06-27 MED ORDER — VITAMIN D (ERGOCALCIFEROL) 1.25 MG (50000 UNIT) PO CAPS: 50000.0000 [IU] | ORAL_CAPSULE | ORAL | 0 refills | Status: DC

## 2019-06-27 MED ORDER — PROPRANOLOL HCL 20 MG PO TABS: 20.0000 mg | ORAL_TABLET | Freq: Three times a day (TID) | ORAL | 0 refills | Status: DC

## 2019-06-27 NOTE — Progress Notes (Signed)
Chief Complaint:   OBESITY Amber Frost is here to discuss her progress with her obesity treatment plan along with follow-up of her obesity related diagnoses. Amber Frost is on the Category 3 Plan and states she is following her eating plan approximately 80% of the time. Amber Frost states she is walking for 45 minutes 1 times per week.  Today's visit was #: 6 Starting weight: 226 lbs Starting date: 02/28/2019 Today's weight: 217 lbs Today's date: 06/27/2019 Total lbs lost to date: 9 lbs Total lbs lost since last in-office visit: 6 lbs  Interim History: Amber Frost reports increased anxiety.  Endorses drinking 2/3 bottle (3 glasses) wine a night.  Subjective:   1. Vitamin D deficiency Amber Frost's Vitamin D level was 36.7 on 02/28/2019. She is currently taking vit D. She denies nausea, vomiting or muscle weakness.  2. Insulin resistance Amber Frost has a diagnosis of insulin resistance based on her elevated fasting insulin level >5. She continues to work on diet and exercise to decrease her risk of diabetes.  She is currently taking Saxenda.   Lab Results  Component Value Date   INSULIN 11.1 02/28/2019   Lab Results  Component Value Date   HGBA1C 5.0 02/28/2019   3. Essential hypertension Review: taking medications as instructed, no medication side effects noted, no chest pain on exertion, no dyspnea on exertion, no swelling of ankles. She is taking lisinopril and chlorthalidone.  BP Readings from Last 3 Encounters:  06/27/19 129/84  05/29/19 126/78  05/08/19 125/76   4. Elevated MCV Amber Frost is not a vegetarian.  She does not have a history of weight loss surgery. Since she does have increased alcohol intake, she is at risk for B vitamin deficiency, with the most concerning being thiamine. Will review with patient.   CBC Latest Ref Rng & Units 02/28/2019 11/15/2017 08/10/2016  WBC 3.4 - 10.8 x10E3/uL 6.4 6.3 6.5  Hemoglobin 11.1 - 15.9 g/dL 14.1 13.5 13.7  Hematocrit 34.0 - 46.6 % 41.8 38.7 39.8    Platelets 150 - 450 x10E3/uL 220 238.0 230.0   Lab Results  Component Value Date   VITAMINB12 399 02/28/2019   5. Anxiety Amber Frost has a history of anxiety disorder. She has a high-stress job. She always feels that she is in the fight or flight mode.   6. Forgetfulness Amber Frost endorses difficulty focusing. She wonders if she has ADD.  Assessment/Plan:   1. Vitamin D deficiency Low Vitamin D level contributes to fatigue and are associated with obesity, breast, and colon cancer. She agrees to continue to take prescription Vitamin D @50 ,000 IU every week and will follow-up for routine testing of Vitamin D, at least 2-3 times per year to avoid over-replacement.  Orders - Vitamin D, Ergocalciferol, (DRISDOL) 1.25 MG (50000 UNIT) CAPS capsule; Take 1 capsule (50,000 Units total) by mouth every 7 (seven) days.  Dispense: 4 capsule; Refill: 0 - VITAMIN D 25 Hydroxy (Vit-D Deficiency, Fractures)  2. Insulin resistance Amber Frost will continue to work on weight loss, exercise, and decreasing simple carbohydrates to help decrease the risk of diabetes. Amber Frost agreed to follow-up with [place] as directed to closely monitor her progress.  Orders - Comprehensive metabolic panel - Insulin, random  3. Essential hypertension Amber Frost is working on healthy weight loss and exercise to improve blood pressure control. We will watch for signs of hypotension as she continues her lifestyle modifications.  4. Elevated MCV Orders and follow up as documented in patient record.  Orders- CBC with Differential/Platelet - Comprehensive metabolic panel  5. Anxiety Behavior modification techniques were discussed today to help Amber Frost deal with her anxiety.  After discussion, patient would like to start below medication. Expectations, risks, and potential side effects reviewed. Orders and follow up as documented in patient record.   Orders - propranolol (INDERAL) 20 MG tablet; Take 1 tablet (20 mg total) by mouth 3 (three)  times daily.  Dispense: 60 tablet; Refill: 0  6. Forgetfulness Will refer to [facility] for ADD testing.  7. Obesity Amber Frost is currently in the action stage of change. As such, her goal is to continue with weight loss efforts. She has agreed to the Category 3 Plan.   Exercise goals: For substantial health benefits, adults should do at least 150 minutes (2 hours and 30 minutes) a week of moderate-intensity, or 75 minutes (1 hour and 15 minutes) a week of vigorous-intensity aerobic physical activity, or an equivalent combination of moderate- and vigorous-intensity aerobic activity. Aerobic activity should be performed in episodes of at least 10 minutes, and preferably, it should be spread throughout the week. Adults should also include muscle-strengthening activities that involve all major muscle groups on 2 or more days a week.  Behavioral modification strategies: increasing water intake and decreasing alcohol intake.  Amber Frost has agreed to follow-up with our clinic in 2 weeks. She was informed of the importance of frequent follow-up visits to maximize her success with intensive lifestyle modifications for her multiple health conditions.   Amber Frost was informed we would discuss her lab results at her next visit unless there is a critical issue that needs to be addressed sooner. Amber Frost agreed to keep her next visit at the agreed upon time to discuss these results.  Objective:   Blood pressure 129/84, pulse (!) 101, temperature 98.7 F (37.1 C), temperature source Oral, height 5\' 4"  (1.626 m), weight 217 lb (98.4 kg), last menstrual period 03/08/2019, SpO2 99 %. Body mass index is 37.25 kg/m.  General: Cooperative, alert, well developed, in no acute distress. HEENT: Conjunctivae and lids unremarkable. Cardiovascular: Regular rhythm.  Lungs: Normal work of breathing. Neurologic: No focal deficits.   Lab Results  Component Value Date   CREATININE 0.73 02/28/2019   BUN 12  02/28/2019   NA 137 02/28/2019   K 4.7 02/28/2019   CL 102 02/28/2019   CO2 21 02/28/2019   Lab Results  Component Value Date   ALT 23 02/28/2019   AST 23 02/28/2019   ALKPHOS 64 02/28/2019   BILITOT 0.4 02/28/2019   Lab Results  Component Value Date   HGBA1C 5.0 02/28/2019   Lab Results  Component Value Date   INSULIN 11.1 02/28/2019   Lab Results  Component Value Date   TSH 1.940 02/28/2019   Lab Results  Component Value Date   CHOL 183 02/28/2019   HDL 71 02/28/2019   LDLCALC 89 02/28/2019   LDLDIRECT 89.0 11/15/2017   TRIG 134 02/28/2019   CHOLHDL 3 11/15/2017   Lab Results  Component Value Date   WBC 6.4 02/28/2019   HGB 14.1 02/28/2019   HCT 41.8 02/28/2019   MCV 100 (H) 02/28/2019   PLT 220 02/28/2019   Attestation Statements:   Reviewed by clinician on day of visit: allergies, medications, problem list, medical history, surgical history, family history, social history, and previous encounter notes.  I, Water quality scientist, CMA, am acting as Location manager for [employer], DO.  I have reviewed the above documentation for accuracy and completeness, and I agree with the above. Briscoe Deutscher,  DO

## 2019-06-28 ENCOUNTER — Encounter (INDEPENDENT_AMBULATORY_CARE_PROVIDER_SITE_OTHER): Payer: Self-pay | Admitting: Family Medicine

## 2019-06-28 LAB — COMPREHENSIVE METABOLIC PANEL
ALT: 23 IU/L (ref 0–32)
AST: 23 IU/L (ref 0–40)
Albumin/Globulin Ratio: 1.6 (ref 1.2–2.2)
Albumin: 4.8 g/dL (ref 3.8–4.8)
Alkaline Phosphatase: 67 IU/L (ref 39–117)
BUN/Creatinine Ratio: 14 (ref 9–23)
BUN: 10 mg/dL (ref 6–24)
Bilirubin Total: 0.3 mg/dL (ref 0.0–1.2)
CO2: 23 mmol/L (ref 20–29)
Calcium: 9.8 mg/dL (ref 8.7–10.2)
Chloride: 98 mmol/L (ref 96–106)
Creatinine, Ser: 0.74 mg/dL (ref 0.57–1.00)
GFR calc Af Amer: 111 mL/min/{1.73_m2} (ref >59)
GFR calc non Af Amer: 96 mL/min/{1.73_m2} (ref >59)
Globulin, Total: 3 g/dL (ref 1.5–4.5)
Glucose: 88 mg/dL (ref 65–99)
Potassium: 3.9 mmol/L (ref 3.5–5.2)
Sodium: 141 mmol/L (ref 134–144)
Total Protein: 7.8 g/dL (ref 6.0–8.5)

## 2019-06-28 LAB — CBC WITH DIFFERENTIAL/PLATELET
Basophils Absolute: 0 10*3/uL (ref 0.0–0.2)
Basos: 1 %
EOS (ABSOLUTE): 0.1 10*3/uL (ref 0.0–0.4)
Eos: 3 %
Hemoglobin: 14.8 g/dL (ref 11.1–15.9)
Immature Grans (Abs): 0 10*3/uL (ref 0.0–0.1)
Immature Granulocytes: 0 %
Lymphocytes Absolute: 2.1 10*3/uL (ref 0.7–3.1)
Lymphs: 47 %
MCH: 34 pg — ABNORMAL HIGH (ref 26.6–33.0)
MCHC: 35.6 g/dL (ref 31.5–35.7)
MCV: 96 fL (ref 79–97)
Monocytes Absolute: 0.5 10*3/uL (ref 0.1–0.9)
Monocytes: 10 %
Neutrophils Absolute: 1.8 10*3/uL (ref 1.4–7.0)
Neutrophils: 39 %
Platelets: 228 10*3/uL (ref 150–450)
RBC: 4.35 x10E6/uL (ref 3.77–5.28)
RDW: 12.3 % (ref 11.7–15.4)
WBC: 4.5 10*3/uL (ref 3.4–10.8)

## 2019-06-28 LAB — ANEMIA PANEL
Ferritin: 256 ng/mL — ABNORMAL HIGH (ref 15–150)
Folate, Hemolysate: 463 ng/mL
Folate, RBC: 1113 ng/mL (ref >498)
Hematocrit: 41.6 % (ref 34.0–46.6)
Iron Saturation: 29 % (ref 15–55)
Iron: 87 ug/dL (ref 27–159)
Retic Ct Pct: 2.6 % (ref 0.6–2.6)
Total Iron Binding Capacity: 300 ug/dL (ref 250–450)
UIBC: 213 ug/dL (ref 131–425)
Vitamin B-12: 661 pg/mL (ref 232–1245)

## 2019-06-28 LAB — VITAMIN D 25 HYDROXY (VIT D DEFICIENCY, FRACTURES): Vit D, 25-Hydroxy: 60.3 ng/mL (ref 30.0–100.0)

## 2019-06-28 LAB — INSULIN, RANDOM: INSULIN: 13.1 u[IU]/mL (ref 2.6–24.9)

## 2019-07-05 ENCOUNTER — Other Ambulatory Visit (INDEPENDENT_AMBULATORY_CARE_PROVIDER_SITE_OTHER): Payer: Self-pay

## 2019-07-05 DIAGNOSIS — F3289 Other specified depressive episodes: Secondary | ICD-10-CM

## 2019-07-05 DIAGNOSIS — I10 Essential (primary) hypertension: Secondary | ICD-10-CM

## 2019-07-05 DIAGNOSIS — E8881 Metabolic syndrome: Secondary | ICD-10-CM

## 2019-07-05 MED ORDER — CHLORTHALIDONE 25 MG PO TABS: 25.0000 mg | ORAL_TABLET | Freq: Every day | ORAL | 0 refills | Status: DC

## 2019-07-05 MED ORDER — BD PEN NEEDLE NANO 2ND GEN 32G X 4 MM MISC: 1.0000 | Freq: Two times a day (BID) | 0 refills | Status: DC

## 2019-07-05 MED ORDER — SAXENDA 18 MG/3ML ~~LOC~~ SOPN: 3.0000 mg | PEN_INJECTOR | Freq: Every day | SUBCUTANEOUS | 0 refills | Status: DC

## 2019-07-05 MED ORDER — FLUOXETINE HCL 20 MG PO TABS: 20.0000 mg | ORAL_TABLET | Freq: Every day | ORAL | 0 refills | Status: DC

## 2019-07-15 ENCOUNTER — Other Ambulatory Visit (INDEPENDENT_AMBULATORY_CARE_PROVIDER_SITE_OTHER): Payer: Self-pay | Admitting: Family Medicine

## 2019-07-15 DIAGNOSIS — F419 Anxiety disorder, unspecified: Secondary | ICD-10-CM

## 2019-07-18 ENCOUNTER — Ambulatory Visit (INDEPENDENT_AMBULATORY_CARE_PROVIDER_SITE_OTHER): Payer: BC Managed Care – PPO | Admitting: Family Medicine

## 2019-07-18 ENCOUNTER — Other Ambulatory Visit: Payer: Self-pay

## 2019-07-18 ENCOUNTER — Encounter (INDEPENDENT_AMBULATORY_CARE_PROVIDER_SITE_OTHER): Payer: Self-pay | Admitting: Family Medicine

## 2019-07-18 VITALS — BP 124/77 | HR 78 | Temp 98.1°F | Ht 64.0 in | Wt 215.0 lb

## 2019-07-18 DIAGNOSIS — R6889 Other general symptoms and signs: Secondary | ICD-10-CM

## 2019-07-18 DIAGNOSIS — I10 Essential (primary) hypertension: Secondary | ICD-10-CM | POA: Diagnosis not present

## 2019-07-18 DIAGNOSIS — E559 Vitamin D deficiency, unspecified: Secondary | ICD-10-CM

## 2019-07-18 DIAGNOSIS — Z9189 Other specified personal risk factors, not elsewhere classified: Secondary | ICD-10-CM

## 2019-07-18 DIAGNOSIS — E8881 Metabolic syndrome: Secondary | ICD-10-CM

## 2019-07-18 DIAGNOSIS — F419 Anxiety disorder, unspecified: Secondary | ICD-10-CM

## 2019-07-18 DIAGNOSIS — Z6836 Body mass index (BMI) 36.0-36.9, adult: Secondary | ICD-10-CM

## 2019-07-18 DIAGNOSIS — F458 Other somatoform disorders: Secondary | ICD-10-CM

## 2019-07-18 MED ORDER — VITAMIN D (ERGOCALCIFEROL) 1.25 MG (50000 UNIT) PO CAPS: 50000.0000 [IU] | ORAL_CAPSULE | ORAL | 0 refills | Status: DC

## 2019-07-18 MED ORDER — BACLOFEN 20 MG PO TABS: 10.0000 mg | ORAL_TABLET | Freq: Every evening | ORAL | 0 refills | Status: DC | PRN

## 2019-07-18 NOTE — Progress Notes (Signed)
Chief Complaint:   OBESITY Amber Frost is here to discuss her progress with her obesity treatment plan along with follow-up of her obesity related diagnoses. Amber Frost is on the Category 3 Plan and states she is following her eating plan approximately 60% of the time. Amber Frost states she is exercising for 0 minutes 0 times per week.  Today's visit was #: 7 Starting weight: 226 lbs Starting date: 02/28/2019 Today's weight: 215 lbs Today's date: 07/18/2019 Total lbs lost to date: 11 lbs Total lbs lost since last in-office visit: 2 lbs  Interim History: Amber Frost is doing well.  She has not heard from [facility] yet.  Propranolol is helping a lot with anxiety and heart rate/blood pressure.  She reports that trazodone is causing "crazy dreams".  Bruxism at night.  Subjective:   1. Vitamin D deficiency Amber Frost's Vitamin D level was 60.3 on 06/27/2019. She is currently taking vit D. She denies nausea, vomiting or muscle weakness.  2. Insulin resistance Amber Frost has a diagnosis of insulin resistance based on her elevated fasting insulin level >5. She continues to work on diet and exercise to decrease her risk of diabetes.  She is taking [place].  Lab Results  Component Value Date   INSULIN 13.1 06/27/2019   INSULIN 11.1 02/28/2019   Lab Results  Component Value Date   HGBA1C 5.0 02/28/2019   3. Essential hypertension Review: taking medications as instructed, no medication side effects noted, no chest pain on exertion, no dyspnea on exertion, no swelling of ankles. She is taking lisinopril and chlorthalidone for blood pressure control.  BP Readings from Last 3 Encounters:  07/18/19 124/77  06/27/19 129/84  05/29/19 126/78   4. Forgetfulness Amber Frost states that she continues to have difficulty focusing.  5. Anxiety Amber Frost states that she always feels that she is in fight or flight mode.  She is under a lot of stress at her job.  6. Bruxism Amber Frost complains of bruxism at  night.  7. At risk for diabetes mellitus Amber Frost is at higher than average risk for developing diabetes due to her obesity.   Assessment/Plan:   1. Vitamin D deficiency Low Vitamin D level contributes to fatigue and are associated with obesity, breast, and colon cancer. She agrees to continue to take prescription Vitamin D @50 ,000 IU every week and will follow-up for routine testing of Vitamin D, at least 2-3 times per year to avoid over-replacement.  Orders - Vitamin D, Ergocalciferol, (DRISDOL) 1.25 MG (50000 UNIT) CAPS capsule; Take 1 capsule (50,000 Units total) by mouth every 14 (fourteen) days.  Dispense: 4 capsule; Refill: 0  2. Insulin resistance Amber Frost will continue to work on weight loss, exercise, and decreasing simple carbohydrates to help decrease the risk of diabetes. Amber Frost agreed to follow-up with [place] as directed to closely monitor her progress.  3. Essential hypertension Amber Frost is working on healthy weight loss and exercise to improve blood pressure control. We will watch for signs of hypotension as she continues her lifestyle modifications.  4. Forgetfulness Will place another referral to [facility].  5. Anxiety Behavior modification techniques were discussed today to help Amber Frost deal with her anxiety.  Orders and follow up as documented in patient record.   6. Bruxism Amber Frost will start baclofen, as below.  Orders - baclofen (LIORESAL) 20 MG tablet; Take 0.5-1 tablets (10-20 mg total) by mouth at bedtime as needed.  Dispense: 30 each; Refill: 0  7. At risk for diabetes mellitus Amber Frost was given approximately  15 minutes of diabetes education and counseling today. We discussed intensive lifestyle modifications today with an emphasis on weight loss as well as increasing exercise and decreasing simple carbohydrates in her diet. We also reviewed medication options with an emphasis on risk versus benefit of those discussed.   Repetitive spaced learning  was employed today to elicit superior memory formation and behavioral change.  8. Class 2 severe obesity with serious comorbidity and body mass index (BMI) of 36.0 to 36.9 in adult, unspecified obesity type (HCC) Amber Frost is currently in the action stage of change. As such, her goal is to continue with weight loss efforts. She has agreed to the Category 3 Plan.   Exercise goals: walking for 30 minutes 3 times a week.  Behavioral modification strategies: increasing lean protein intake and increasing water intake.  Amber Frost has agreed to follow-up with our clinic in 2 weeks. She was informed of the importance of frequent follow-up visits to maximize her success with intensive lifestyle modifications for her multiple health conditions.   Objective:   Blood pressure 124/77, pulse 78, temperature 98.1 F (36.7 C), temperature source Oral, height 5\' 4"  (1.626 m), weight 215 lb (97.5 kg), SpO2 99 %. Body mass index is 36.9 kg/m.  General: Cooperative, alert, well developed, in no acute distress. HEENT: Conjunctivae and lids unremarkable. Cardiovascular: Regular rhythm.  Lungs: Normal work of breathing. Neurologic: No focal deficits.   Lab Results  Component Value Date   CREATININE 0.74 06/27/2019   BUN 10 06/27/2019   NA 141 06/27/2019   K 3.9 06/27/2019   CL 98 06/27/2019   CO2 23 06/27/2019   Lab Results  Component Value Date   ALT 23 06/27/2019   AST 23 06/27/2019   ALKPHOS 67 06/27/2019   BILITOT 0.3 06/27/2019   Lab Results  Component Value Date   HGBA1C 5.0 02/28/2019   Lab Results  Component Value Date   INSULIN 13.1 06/27/2019   INSULIN 11.1 02/28/2019   Lab Results  Component Value Date   TSH 1.940 02/28/2019   Lab Results  Component Value Date   CHOL 183 02/28/2019   HDL 71 02/28/2019   LDLCALC 89 02/28/2019   LDLDIRECT 89.0 11/15/2017   TRIG 134 02/28/2019   CHOLHDL 3 11/15/2017   Lab Results  Component Value Date   WBC 4.5 06/27/2019   HGB 14.8  06/27/2019   HCT 41.6 06/27/2019   MCV 96 06/27/2019   PLT 228 06/27/2019   Lab Results  Component Value Date   IRON 87 06/27/2019   TIBC 300 06/27/2019   FERRITIN 256 (H) 06/27/2019   Attestation Statements:   Reviewed by clinician on day of visit: allergies, medications, problem list, medical history, surgical history, family history, social history, and previous encounter notes.  I, Water quality scientist, CMA, am acting as Location manager for [employer], DO.  I have reviewed the above documentation for accuracy and completeness, and I agree with the above. Briscoe Deutscher, DO

## 2019-08-01 ENCOUNTER — Encounter (INDEPENDENT_AMBULATORY_CARE_PROVIDER_SITE_OTHER): Payer: Self-pay | Admitting: Family Medicine

## 2019-08-01 ENCOUNTER — Ambulatory Visit (INDEPENDENT_AMBULATORY_CARE_PROVIDER_SITE_OTHER): Payer: BC Managed Care – PPO | Admitting: Family Medicine

## 2019-08-01 NOTE — Telephone Encounter (Signed)
FYI

## 2019-08-03 ENCOUNTER — Other Ambulatory Visit (INDEPENDENT_AMBULATORY_CARE_PROVIDER_SITE_OTHER): Payer: Self-pay | Admitting: Family Medicine

## 2019-08-03 DIAGNOSIS — I10 Essential (primary) hypertension: Secondary | ICD-10-CM

## 2019-08-03 DIAGNOSIS — E559 Vitamin D deficiency, unspecified: Secondary | ICD-10-CM

## 2019-08-03 DIAGNOSIS — F458 Other somatoform disorders: Secondary | ICD-10-CM

## 2019-08-03 DIAGNOSIS — E8881 Metabolic syndrome: Secondary | ICD-10-CM

## 2019-08-03 DIAGNOSIS — F419 Anxiety disorder, unspecified: Secondary | ICD-10-CM

## 2019-08-06 MED ORDER — CHLORTHALIDONE 25 MG PO TABS: 25.0000 mg | ORAL_TABLET | Freq: Every day | ORAL | 0 refills | Status: DC

## 2019-08-06 MED ORDER — SAXENDA 18 MG/3ML ~~LOC~~ SOPN: 3.0000 mg | PEN_INJECTOR | Freq: Every day | SUBCUTANEOUS | 0 refills | Status: DC

## 2019-08-06 MED ORDER — PROPRANOLOL HCL 20 MG PO TABS: 20.0000 mg | ORAL_TABLET | Freq: Three times a day (TID) | ORAL | 0 refills | Status: DC

## 2019-08-09 ENCOUNTER — Telehealth (INDEPENDENT_AMBULATORY_CARE_PROVIDER_SITE_OTHER): Payer: BC Managed Care – PPO | Admitting: Family Medicine

## 2019-08-09 ENCOUNTER — Encounter (INDEPENDENT_AMBULATORY_CARE_PROVIDER_SITE_OTHER): Payer: Self-pay | Admitting: Family Medicine

## 2019-08-09 ENCOUNTER — Other Ambulatory Visit: Payer: Self-pay

## 2019-08-09 DIAGNOSIS — F3289 Other specified depressive episodes: Secondary | ICD-10-CM | POA: Diagnosis not present

## 2019-08-09 DIAGNOSIS — G4709 Other insomnia: Secondary | ICD-10-CM

## 2019-08-09 DIAGNOSIS — E559 Vitamin D deficiency, unspecified: Secondary | ICD-10-CM | POA: Diagnosis not present

## 2019-08-09 DIAGNOSIS — E88819 Insulin resistance, unspecified: Secondary | ICD-10-CM

## 2019-08-09 DIAGNOSIS — Z6836 Body mass index (BMI) 36.0-36.9, adult: Secondary | ICD-10-CM

## 2019-08-09 DIAGNOSIS — E8881 Metabolic syndrome: Secondary | ICD-10-CM | POA: Diagnosis not present

## 2019-08-09 MED ORDER — VITAMIN D (ERGOCALCIFEROL) 1.25 MG (50000 UNIT) PO CAPS: 50000.0000 [IU] | ORAL_CAPSULE | ORAL | 0 refills | Status: DC

## 2019-08-09 MED ORDER — TEMAZEPAM 7.5 MG PO CAPS: 7.5000 mg | ORAL_CAPSULE | Freq: Every evening | ORAL | 0 refills | Status: DC | PRN

## 2019-08-09 MED ORDER — BUPROPION HCL ER (XL) 300 MG PO TB24: 300.0000 mg | ORAL_TABLET | Freq: Every day | ORAL | 0 refills | Status: DC

## 2019-08-09 NOTE — Progress Notes (Signed)
TeleHealth Visit:  Due to the COVID-19 pandemic, this visit was completed with telemedicine (audio/video) technology to reduce patient and provider exposure as well as to preserve personal protective equipment.   Amber Frost has verbally consented to this TeleHealth visit. The patient is located at home, the provider is located at the [facility] office. The participants in this visit include the listed provider and patient. The visit was conducted today via [email or web address].  Chief Complaint: OBESITY Amber Frost is here to discuss her progress with her obesity treatment plan along with follow-up of her obesity related diagnoses. Amber Frost is on the Category 3 Plan and states she is following her eating plan approximately 40% of the time. Amber Frost states she is exercising for 0 minutes 0 times per week.  Today's visit was #: 8 Starting weight: 226 lbs Starting date: 02/28/2019  Interim History: Amber Frost says work is incredibly stressful.  She will be taking spring break off (in April).  She says she has been drinking water.  She reports her sleep is poor and causing her to struggle with good choices.  Subjective:   1. Other insomnia She says that baclofen and Trazodone help, but cause AM grogginess.  2. Insulin resistance Andralyn has a diagnosis of insulin resistance based on her elevated fasting insulin level >5. She continues to work on diet and exercise to decrease her risk of diabetes.  She is taking [place] daily.  Lab Results  Component Value Date   INSULIN 13.1 06/27/2019   INSULIN 11.1 02/28/2019   Lab Results  Component Value Date   HGBA1C 5.0 02/28/2019   3. Vitamin D deficiency Amber Frost's Vitamin D level was 60.3 on 06/27/2019. She is currently taking vit D. She denies nausea, vomiting or muscle weakness.  4. Other depression, with emotional eating Amber Frost is struggling with emotional eating and using food for comfort to the extent that it is negatively impacting her health. She  has been working on behavior modification techniques to help reduce her emotional eating and has been unsuccessful. She shows no sign of suicidal or homicidal ideations.  She is taking Wellbutrin 300 mg daily and Prozac 20 mg daily.  Assessment/Plan:   1. Other insomnia The problem of recurrent insomnia was discussed. Orders and follow up as documented in patient record. Counseling: Intensive lifestyle modifications are the first line treatment for this issue. We discussed several lifestyle modifications today and she will continue to work on diet, exercise and weight loss efforts.   Counseling  Limit or avoid alcohol, caffeinated beverages, and cigarettes, especially close to bedtime.   Do not eat a large meal or eat spicy foods right before bedtime. This can lead to digestive discomfort that can make it hard for you to sleep.  Keep a sleep diary to help you and your health care provider figure out what could be causing your insomnia.  . Make your bedroom a dark, comfortable place where it is easy to fall asleep. ? Put up shades or blackout curtains to block light from outside. ? Use a white noise machine to block noise. ? Keep the temperature cool. . Limit screen use before bedtime. This includes: ? Watching TV. ? Using your smartphone, tablet, or computer. . Stick to a routine that includes going to bed and waking up at the same times every day and night. This can help you fall asleep faster. Consider making a quiet activity, such as reading, part of your nighttime routine. . Try to avoid taking naps during  the day so that you sleep better at night. . Get out of bed if you are still awake after 15 minutes of trying to sleep. Keep the lights down, but try reading or doing a quiet activity. When you feel sleepy, go back to bed.  Orders - temazepam (RESTORIL) 7.5 MG capsule; Take 1 capsule (7.5 mg total) by mouth at bedtime as needed for sleep.  Dispense: 30 capsule; Refill: 0  2. Insulin  resistance Pauli will continue to work on weight loss, exercise, and decreasing simple carbohydrates to help decrease the risk of diabetes. [name] agreed to follow-up with [place] as directed to closely monitor her progress.  3. Vitamin D deficiency Low Vitamin D level contributes to fatigue and are associated with obesity, breast, and colon cancer. She agrees to continue to take prescription Vitamin D @50 ,000 IU every week and will follow-up for routine testing of Vitamin D, at least 2-3 times per year to avoid over-replacement.  Orders - Vitamin D, Ergocalciferol, (DRISDOL) 1.25 MG (50000 UNIT) CAPS capsule; Take 1 capsule (50,000 Units total) by mouth every 14 (fourteen) days.  Dispense: 4 capsule; Refill: 0  4. Other depression, with emotional eating Behavior modification techniques were discussed today to help Amber Frost deal with her emotional/non-hunger eating behaviors.  Orders and follow up as documented in patient record.   Orders - buPROPion (WELLBUTRIN XL) 300 MG 24 hr tablet; Take 1 tablet (300 mg total) by mouth daily.  Dispense: 30 tablet; Refill: 0  5. Class 2 severe obesity with serious comorbidity and body mass index (BMI) of 36.0 to 36.9 in adult, unspecified obesity type (HCC) Amber Frost is currently in the action stage of change. As such, her goal is to continue with weight loss efforts. She has agreed to the Category 3 Plan.   Exercise goals: For substantial health benefits, adults should do at least 150 minutes (2 hours and 30 minutes) a week of moderate-intensity, or 75 minutes (1 hour and 15 minutes) a week of vigorous-intensity aerobic physical activity, or an equivalent combination of moderate- and vigorous-intensity aerobic activity. Aerobic activity should be performed in episodes of at least 10 minutes, and preferably, it should be spread throughout the week.  Behavioral modification strategies: increasing lean protein intake, increasing water intake and dealing with family or  coworker sabotage.  Amber Frost has agreed to follow-up with our clinic in 3 weeks. She was informed of the importance of frequent follow-up visits to maximize her success with intensive lifestyle modifications for her multiple health conditions.  Objective:   VITALS: Per patient if applicable, see vitals. GENERAL: Alert and in no acute distress. CARDIOPULMONARY: No increased WOB. Speaking in clear sentences.  PSYCH: Pleasant and cooperative. Speech normal rate and rhythm. Affect is appropriate. Insight and judgement are appropriate. Attention is focused, linear, and appropriate.  NEURO: Oriented as arrived to appointment on time with no prompting.   Lab Results  Component Value Date   CREATININE 0.74 06/27/2019   BUN 10 06/27/2019   NA 141 06/27/2019   K 3.9 06/27/2019   CL 98 06/27/2019   CO2 23 06/27/2019   Lab Results  Component Value Date   ALT 23 06/27/2019   AST 23 06/27/2019   ALKPHOS 67 06/27/2019   BILITOT 0.3 06/27/2019   Lab Results  Component Value Date   HGBA1C 5.0 02/28/2019   Lab Results  Component Value Date   INSULIN 13.1 06/27/2019   INSULIN 11.1 02/28/2019   Lab Results  Component Value Date   TSH  1.940 02/28/2019   Lab Results  Component Value Date   CHOL 183 02/28/2019   HDL 71 02/28/2019   LDLCALC 89 02/28/2019   LDLDIRECT 89.0 11/15/2017   TRIG 134 02/28/2019   CHOLHDL 3 11/15/2017   Lab Results  Component Value Date   WBC 4.5 06/27/2019   HGB 14.8 06/27/2019   HCT 41.6 06/27/2019   MCV 96 06/27/2019   PLT 228 06/27/2019   Lab Results  Component Value Date   IRON 87 06/27/2019   TIBC 300 06/27/2019   FERRITIN 256 (H) 06/27/2019   Attestation Statements:   Reviewed by clinician on day of visit: allergies, medications, problem list, medical history, surgical history, family history, social history, and previous encounter notes.  I, Water quality scientist, CMA, am acting as Location manager for [employer], DO.  I have reviewed the above  documentation for accuracy and completeness, and I agree with the above. Briscoe Deutscher, DO

## 2019-08-10 ENCOUNTER — Other Ambulatory Visit: Payer: Self-pay

## 2019-08-10 DIAGNOSIS — F3289 Other specified depressive episodes: Secondary | ICD-10-CM

## 2019-08-10 MED ORDER — BUPROPION HCL ER (XL) 300 MG PO TB24: ORAL_TABLET | ORAL | 1 refills | Status: DC

## 2019-08-28 ENCOUNTER — Other Ambulatory Visit (INDEPENDENT_AMBULATORY_CARE_PROVIDER_SITE_OTHER): Payer: Self-pay | Admitting: Family Medicine

## 2019-08-28 DIAGNOSIS — I10 Essential (primary) hypertension: Secondary | ICD-10-CM

## 2019-09-02 ENCOUNTER — Other Ambulatory Visit (INDEPENDENT_AMBULATORY_CARE_PROVIDER_SITE_OTHER): Payer: Self-pay | Admitting: Family Medicine

## 2019-09-02 DIAGNOSIS — E8881 Metabolic syndrome: Secondary | ICD-10-CM

## 2019-09-03 ENCOUNTER — Other Ambulatory Visit: Payer: Self-pay

## 2019-09-03 ENCOUNTER — Ambulatory Visit (INDEPENDENT_AMBULATORY_CARE_PROVIDER_SITE_OTHER): Payer: BC Managed Care – PPO | Admitting: Family Medicine

## 2019-09-03 ENCOUNTER — Encounter (INDEPENDENT_AMBULATORY_CARE_PROVIDER_SITE_OTHER): Payer: Self-pay | Admitting: Family Medicine

## 2019-09-03 ENCOUNTER — Other Ambulatory Visit (INDEPENDENT_AMBULATORY_CARE_PROVIDER_SITE_OTHER): Payer: Self-pay | Admitting: Family Medicine

## 2019-09-03 VITALS — BP 127/79 | HR 81 | Temp 98.1°F | Ht 64.0 in | Wt 218.0 lb

## 2019-09-03 DIAGNOSIS — J302 Other seasonal allergic rhinitis: Secondary | ICD-10-CM

## 2019-09-03 DIAGNOSIS — E88819 Insulin resistance, unspecified: Secondary | ICD-10-CM

## 2019-09-03 DIAGNOSIS — F3289 Other specified depressive episodes: Secondary | ICD-10-CM

## 2019-09-03 DIAGNOSIS — E8881 Metabolic syndrome: Secondary | ICD-10-CM

## 2019-09-03 DIAGNOSIS — F419 Anxiety disorder, unspecified: Secondary | ICD-10-CM

## 2019-09-03 DIAGNOSIS — I10 Essential (primary) hypertension: Secondary | ICD-10-CM

## 2019-09-03 DIAGNOSIS — G4709 Other insomnia: Secondary | ICD-10-CM | POA: Diagnosis not present

## 2019-09-03 DIAGNOSIS — E559 Vitamin D deficiency, unspecified: Secondary | ICD-10-CM

## 2019-09-03 DIAGNOSIS — Z6837 Body mass index (BMI) 37.0-37.9, adult: Secondary | ICD-10-CM

## 2019-09-03 MED ORDER — CHLORTHALIDONE 25 MG PO TABS: 25.0000 mg | ORAL_TABLET | Freq: Every day | ORAL | 0 refills | Status: DC

## 2019-09-03 MED ORDER — BUPROPION HCL ER (XL) 300 MG PO TB24: 300.0000 mg | ORAL_TABLET | Freq: Every day | ORAL | 0 refills | Status: DC

## 2019-09-03 MED ORDER — TEMAZEPAM 7.5 MG PO CAPS: 7.5000 mg | ORAL_CAPSULE | Freq: Every evening | ORAL | 0 refills | Status: DC | PRN

## 2019-09-03 MED ORDER — CETIRIZINE HCL 10 MG PO TABS: 10.0000 mg | ORAL_TABLET | Freq: Every day | ORAL | 0 refills | Status: DC

## 2019-09-03 MED ORDER — SAXENDA 18 MG/3ML ~~LOC~~ SOPN: 3.0000 mg | PEN_INJECTOR | Freq: Every day | SUBCUTANEOUS | 0 refills | Status: DC

## 2019-09-03 MED ORDER — VITAMIN D (ERGOCALCIFEROL) 1.25 MG (50000 UNIT) PO CAPS: 50000.0000 [IU] | ORAL_CAPSULE | ORAL | 0 refills | Status: DC

## 2019-09-03 NOTE — Progress Notes (Signed)
Chief Complaint:   OBESITY Amber Frost is here to discuss her progress with her obesity treatment plan along with follow-up of her obesity related diagnoses. Amber Frost is on the Category 3 Plan and states she is following her eating plan approximately 50% of the time. Amber Frost states she is doing housework and yard work.  Today's visit was #: 9 Starting weight: 226 lbs Starting date: 02/28/2019 Today's weight: 218 lbs Today's date: 09/03/2019 Total lbs lost to date: 8 lbs Total lbs lost since last in-office visit: 0  Interim History: Amber Frost says that her work-related stress peaked, but she should be able to resolve several staff-related issues this week.  She will be going to the beach next week.  She endorses emotional eating.  Denies polyphagia.  Subjective:   1. Insulin resistance Amber Frost has a diagnosis of insulin resistance based on her elevated fasting insulin level >5. She continues to work on diet and exercise to decrease her risk of diabetes.  Lab Results  Component Value Date   INSULIN 13.1 06/27/2019   INSULIN 11.1 02/28/2019   Lab Results  Component Value Date   HGBA1C 5.0 02/28/2019   2. Other insomnia Amber Frost endorses difficulty sleeping.  3. Essential hypertension Review: taking medications as instructed, no medication side effects noted, no chest pain on exertion, no dyspnea on exertion, no swelling of ankles.  Amber Frost is taking chlorthalidone 25 mg daily for blood pressure.  BP Readings from Last 3 Encounters:  09/03/19 127/79  07/18/19 124/77  06/27/19 129/84   4. Vitamin D deficiency Amber Frost's Vitamin D level was 60.3 on 06/27/2019. She is currently taking prescription vitamin D 50,000 IU each week. She denies nausea, vomiting or muscle weakness.  5. Seasonal allergies Amber Frost says she is having some difficulty with seasonal allergies right now.  6. Other depression, with emotional eating Amber Frost is struggling with emotional eating and using food for comfort to  the extent that it is negatively impacting her health. She has been working on behavior modification techniques to help reduce her emotional eating and has been unsuccessful. She shows no sign of suicidal or homicidal ideations.  She is taking Wellbutrin 300 XL daily.  Assessment/Plan:   1. Insulin resistance Amber Frost will continue to work on weight loss, exercise, and decreasing simple carbohydrates to help decrease the risk of diabetes. Amber Frost agreed to follow-up with Amber Frost as directed to closely monitor her progress.  2. Other insomnia Amber Frost is taking Restoril for insomnia.  Orders - temazepam (RESTORIL) 7.5 MG capsule; Take 1 capsule (7.5 mg total) by mouth at bedtime as needed for sleep.  Dispense: 30 capsule; Refill: 0  3. Essential hypertension Amber Frost is working on healthy weight loss and exercise to improve blood pressure control. We will watch for signs of hypotension as she continues her lifestyle modifications. - chlorthalidone (HYGROTON) 25 MG tablet; Take 1 tablet (25 mg total) by mouth daily.  Dispense: 90 tablet; Refill: 0  4. Vitamin D deficiency Low Vitamin D level contributes to fatigue and are associated with obesity, breast, and colon cancer. She agrees to continue to take prescription Vitamin D @50 ,000 IU every week and will follow-up for routine testing of Vitamin D, at least 2-3 times per year to avoid over-replacement.  Orders - Vitamin D, Ergocalciferol, (DRISDOL) 1.25 MG (50000 UNIT) CAPS capsule; Take 1 capsule (50,000 Units total) by mouth every 7 (seven) days.  Dispense: 4 capsule; Refill: 0  5. Seasonal allergies Will start Amber Frost on Zyrtec, as below.  Orders -  cetirizine (ZYRTEC ALLERGY) 10 MG tablet; Take 1 tablet (10 mg total) by mouth daily.  Dispense: 90 tablet; Refill: 0  6. Other depression, with emotional eating Behavior modification techniques were discussed today to help Amber Frost deal with her emotional/non-hunger eating behaviors.  Orders and follow up  as documented in patient record.   Orders - buPROPion (WELLBUTRIN XL) 300 MG 24 hr tablet; Take 1 tablet (300 mg total) by mouth daily.  Dispense: 90 tablet; Refill: 0  7. Class 2 severe obesity with serious comorbidity and body mass index (BMI) of 37.0 to 37.9 in adult, unspecified obesity type ([place])  Orders - Liraglutide -Weight Management (SAXENDA) 18 MG/3ML SOPN; Inject 0.5 mLs (3 mg total) into the skin daily.  Dispense: 5 pen; Refill: 0  Amber Frost is currently in the action stage of change. As such, her goal is to continue with weight loss efforts. She has agreed to the Category 3 Plan.   Exercise goals: Walking for stress reduction.  Behavioral modification strategies: increasing lean protein intake, decreasing simple carbohydrates and increasing water intake.  Amber Frost has agreed to follow-up with our clinic in 2-3 weeks. She was informed of the importance of frequent follow-up visits to maximize her success with intensive lifestyle modifications for her multiple health conditions.   Objective:   Blood pressure 127/79, pulse 81, temperature 98.1 F (36.7 C), temperature source Oral, height 5\' 4"  (1.626 m), weight 218 lb (98.9 kg), SpO2 97 %. Body mass index is 37.42 kg/m.  General: Cooperative, alert, well developed, in no acute distress. HEENT: Conjunctivae and lids unremarkable. Cardiovascular: Regular rhythm.  Lungs: Normal work of breathing. Neurologic: No focal deficits.   Lab Results  Component Value Date   CREATININE 0.74 06/27/2019   BUN 10 06/27/2019   NA 141 06/27/2019   K 3.9 06/27/2019   CL 98 06/27/2019   CO2 23 06/27/2019   Lab Results  Component Value Date   ALT 23 06/27/2019   AST 23 06/27/2019   ALKPHOS 67 06/27/2019   BILITOT 0.3 06/27/2019   Lab Results  Component Value Date   HGBA1C 5.0 02/28/2019   Lab Results  Component Value Date   INSULIN 13.1 06/27/2019   INSULIN 11.1 02/28/2019   Lab Results  Component Value Date   TSH 1.940  02/28/2019   Lab Results  Component Value Date   CHOL 183 02/28/2019   HDL 71 02/28/2019   LDLCALC 89 02/28/2019   LDLDIRECT 89.0 11/15/2017   TRIG 134 02/28/2019   CHOLHDL 3 11/15/2017   Lab Results  Component Value Date   WBC 4.5 06/27/2019   HGB 14.8 06/27/2019   HCT 41.6 06/27/2019   MCV 96 06/27/2019   PLT 228 06/27/2019   Lab Results  Component Value Date   IRON 87 06/27/2019   TIBC 300 06/27/2019   FERRITIN 256 (H) 06/27/2019   Attestation Statements:   Reviewed by clinician on day of visit: allergies, medications, problem list, medical history, surgical history, family history, social history, and previous encounter notes.  I, Water quality scientist, CMA, am acting as Location manager for [employer], DO.  I have reviewed the above documentation for accuracy and completeness, and I agree with the above. Briscoe Deutscher, DO

## 2019-09-05 ENCOUNTER — Ambulatory Visit: Payer: BC Managed Care – PPO | Admitting: Dermatology

## 2019-09-05 ENCOUNTER — Other Ambulatory Visit: Payer: Self-pay

## 2019-09-05 DIAGNOSIS — L905 Scar conditions and fibrosis of skin: Secondary | ICD-10-CM

## 2019-09-05 DIAGNOSIS — L820 Inflamed seborrheic keratosis: Secondary | ICD-10-CM | POA: Diagnosis not present

## 2019-09-05 NOTE — Progress Notes (Signed)
   Follow-Up Visit   Subjective  Amber Frost is a 49 y.o. female who presents for the following: Seborrheic Keratosis (6 weeks f/u L medial, medial canthus, treated with LN2, improving ) and scar from previous traumatic injury.  (6 weeks f/u R infranasal scar, treated with ILK, improving ). The patient feels the seborrheic keratoses have resolved.  She feels the scars improved after injection last visit and would like to have additional treatment today to improve it more if possible.   The following portions of the chart were reviewed this encounter and updated as appropriate:     Review of Systems: No other skin or systemic complaints.  Objective  Well appearing patient in no apparent distress; mood and affect are within normal limits.  A focused examination was performed including chest, axillae, abdomen, back, and buttocks and R infranasal, L medial canthus, medial canthus . Relevant physical exam findings are noted in the Assessment and Plan.  Objective  L medial canthus, medial canthus: Clear skin no sign of recurrence  Objective  R infranasal: 0.7 x 0.4 cm pink scar   Images      Assessment & Plan  Inflamed seborrheic keratosis L medial canthus, medial canthus Clear today post tx. The patient will observe these symptoms, and report promptly any worsening or unexpected persistence.  If well, may return prn.    Scar R infranasal Improved after steroid inj last visit, but persistent.  Pt desires continued tx. Kenalog 1.25 mg 0.5 cc injected  #1 site injected   Lot [id number] Exp 09/2020  Intralesional injection - R infranasal Location: R infranasal   Informed Consent: Discussed risks (infection, pain, bleeding, bruising, thinning of the skin, loss of skin pigment, lack of resolution, and recurrence of lesion) and benefits of the procedure, as well as the alternatives. Informed consent was obtained. Preparation: The area was prepared a standard  fashion.    Procedure Details: An intralesional injection was performed with Kenalog 1.25 mg/cc   .05 cc in total were injected.  Total number of injections: 1  Plan: The patient was instructed on post-op care. Recommend OTC analgesia as needed for pain.    Return in about 6 weeks (around 10/17/2019) for scar .   IMarye Round, CMA, am acting as scribe for Sarina Ser, MD .

## 2019-09-11 ENCOUNTER — Encounter (INDEPENDENT_AMBULATORY_CARE_PROVIDER_SITE_OTHER): Payer: Self-pay | Admitting: *Deleted

## 2019-09-19 ENCOUNTER — Ambulatory Visit (INDEPENDENT_AMBULATORY_CARE_PROVIDER_SITE_OTHER): Payer: BC Managed Care – PPO | Admitting: Family Medicine

## 2019-09-25 ENCOUNTER — Other Ambulatory Visit (INDEPENDENT_AMBULATORY_CARE_PROVIDER_SITE_OTHER): Payer: Self-pay | Admitting: Family Medicine

## 2019-09-25 DIAGNOSIS — F419 Anxiety disorder, unspecified: Secondary | ICD-10-CM

## 2019-10-01 ENCOUNTER — Other Ambulatory Visit (INDEPENDENT_AMBULATORY_CARE_PROVIDER_SITE_OTHER): Payer: Self-pay | Admitting: Family Medicine

## 2019-10-01 DIAGNOSIS — F3289 Other specified depressive episodes: Secondary | ICD-10-CM

## 2019-10-03 ENCOUNTER — Ambulatory Visit (INDEPENDENT_AMBULATORY_CARE_PROVIDER_SITE_OTHER): Payer: BC Managed Care – PPO | Admitting: Family Medicine

## 2019-10-03 ENCOUNTER — Other Ambulatory Visit (INDEPENDENT_AMBULATORY_CARE_PROVIDER_SITE_OTHER): Payer: Self-pay | Admitting: Family Medicine

## 2019-10-03 ENCOUNTER — Other Ambulatory Visit: Payer: Self-pay

## 2019-10-03 ENCOUNTER — Encounter (INDEPENDENT_AMBULATORY_CARE_PROVIDER_SITE_OTHER): Payer: Self-pay | Admitting: Family Medicine

## 2019-10-03 VITALS — BP 124/82 | HR 85 | Temp 98.3°F | Ht 64.0 in | Wt 212.0 lb

## 2019-10-03 DIAGNOSIS — F5104 Psychophysiologic insomnia: Secondary | ICD-10-CM | POA: Diagnosis not present

## 2019-10-03 DIAGNOSIS — Z6836 Body mass index (BMI) 36.0-36.9, adult: Secondary | ICD-10-CM

## 2019-10-03 DIAGNOSIS — F419 Anxiety disorder, unspecified: Secondary | ICD-10-CM

## 2019-10-03 DIAGNOSIS — E559 Vitamin D deficiency, unspecified: Secondary | ICD-10-CM

## 2019-10-03 DIAGNOSIS — F909 Attention-deficit hyperactivity disorder, unspecified type: Secondary | ICD-10-CM | POA: Diagnosis not present

## 2019-10-03 MED ORDER — TEMAZEPAM 7.5 MG PO CAPS: 7.5000 mg | ORAL_CAPSULE | Freq: Every evening | ORAL | 0 refills | Status: DC | PRN

## 2019-10-03 MED ORDER — CHLORTHALIDONE 25 MG PO TABS: 25.0000 mg | ORAL_TABLET | Freq: Every day | ORAL | 0 refills | Status: DC

## 2019-10-03 MED ORDER — BD PEN NEEDLE NANO 2ND GEN 32G X 4 MM MISC: 1.0000 | Freq: Two times a day (BID) | 0 refills | Status: DC

## 2019-10-03 MED ORDER — BACLOFEN 20 MG PO TABS: 10.0000 mg | ORAL_TABLET | Freq: Every evening | ORAL | 0 refills | Status: DC | PRN

## 2019-10-03 MED ORDER — VITAMIN D (ERGOCALCIFEROL) 1.25 MG (50000 UNIT) PO CAPS: 50000.0000 [IU] | ORAL_CAPSULE | ORAL | 0 refills | Status: DC

## 2019-10-03 MED ORDER — SAXENDA 18 MG/3ML ~~LOC~~ SOPN: 3.0000 mg | PEN_INJECTOR | Freq: Every day | SUBCUTANEOUS | 0 refills | Status: DC

## 2019-10-03 MED ORDER — BUPROPION HCL ER (XL) 150 MG PO TB24: 150.0000 mg | ORAL_TABLET | Freq: Every day | ORAL | 0 refills | Status: DC

## 2019-10-03 MED ORDER — FLUOXETINE HCL 20 MG PO TABS: 20.0000 mg | ORAL_TABLET | Freq: Every day | ORAL | 0 refills | Status: DC

## 2019-10-03 NOTE — Progress Notes (Signed)
Chief Complaint:   OBESITY Amber Frost is here to discuss her progress with her obesity treatment plan along with follow-up of her obesity related diagnoses. Amber Frost is on the Category 3 Plan and states she is following her eating plan approximately 70% of the time. Amber Frost states she is walking for 30 minutes 3 times per week.  Today's visit was #: 10 Starting weight: 226 lbs Starting date: 02/28/2019 Today's weight: 212 lbs Today's date: 10/03/2019 Total lbs lost to date: 14 lbs Total lbs lost since last in-office visit: 6 lbs  Interim History: Amber Frost saw Dr. Johnnye Sima today and was confirmed to have ADHD and social anxiety.  She is considering starting Vyvanse.  She began taking Saxenda at last visit and is tolerating it well. She was able to get away for a week (went to the beach). She feels much more relaxed than at our last visit.   Subjective:   1. Vitamin D deficiency Satina's Vitamin D level was 60.3 on 06/27/2019. She is currently taking prescription vitamin D 50,000 IU every week. She denies nausea, vomiting or muscle weakness.  2. Psychophysiologic insomnia, with tension headaches and myalgias Amber Frost takes Restoril and baclofen as needed.  3. Attention deficit hyperactivity disorder (ADHD) This is a new diagnosis.  She will be starting Vyvanse. Followed now by [facility].  4. Anxiety, with emotional eating Amber Frost is taking Wellbutrin and Saxenda.  Assessment/Plan:   1. Vitamin D deficiency Low Vitamin D level contributes to fatigue and are associated with obesity, breast, and colon cancer. She agrees to change her prescription Vitamin D dose to @50 ,000 IU every other week and will follow-up for routine testing of Vitamin D, at least 2-3 times per year to avoid over-replacement.  Orders - Vitamin D, Ergocalciferol, (DRISDOL) 1.25 MG (50000 UNIT) CAPS capsule; Take 1 capsule (50,000 Units total) by mouth every 14 (fourteen) days.  Dispense: 2 capsule;  Refill: 0  2. Psychophysiologic insomnia The problem of recurrent insomnia was discussed. Orders and follow up as documented in patient record. Counseling: Intensive lifestyle modifications are the first line treatment for this issue. We discussed several lifestyle modifications today and she will continue to work on diet, exercise and weight loss efforts.   Counseling  Limit or avoid alcohol, caffeinated beverages, and cigarettes, especially close to bedtime.   Do not eat a large meal or eat spicy foods right before bedtime. This can lead to digestive discomfort that can make it hard for you to sleep.  Keep a sleep diary to help you and your health care provider figure out what could be causing your insomnia.  . Make your bedroom a dark, comfortable place where it is easy to fall asleep. ? Put up shades or blackout curtains to block light from outside. ? Use a white noise machine to block noise. ? Keep the temperature cool. . Limit screen use before bedtime. This includes: ? Watching TV. ? Using your smartphone, tablet, or computer. . Stick to a routine that includes going to bed and waking up at the same times every day and night. This can help you fall asleep faster. Consider making a quiet activity, such as reading, part of your nighttime routine. . Try to avoid taking naps during the day so that you sleep better at night. . Get out of bed if you are still awake after 15 minutes of trying to sleep. Keep the lights down, but try reading or doing a quiet activity. When you feel sleepy, go  back to bed.  Orders - baclofen (LIORESAL) 20 MG tablet; Take 0.5-1 tablets (10-20 mg total) by mouth at bedtime as needed.  Dispense: 30 each; Refill: 0 - temazepam (RESTORIL) 7.5 MG capsule; Take 1 capsule (7.5 mg total) by mouth at bedtime as needed for sleep.  Dispense: 30 capsule; Refill: 0  3. Attention deficit hyperactivity disorder (ADHD), unspecified ADHD type Followed by Dr. Johnnye Sima for  this problem. Those encounter notes were reviewed. Orders and follow up as documented in patient record.  4. Anxiety, with emotional eating Behavior modification techniques were discussed today to help Amber Frost deal with her emotional/non-hunger eating behaviors.  Orders and follow up as documented in patient record.  Will decrease Wellbutrin to 150 mg daily.   Orders - FLUoxetine (PROZAC) 20 MG tablet; Take 1 tablet (20 mg total) by mouth daily.  Dispense: 90 tablet; Refill: 0 - buPROPion (WELLBUTRIN XL) 150 MG 24 hr tablet; Take 1 tablet (150 mg total) by mouth daily.  Dispense: 30 tablet; Refill: 0  5. Class 2 severe obesity with serious comorbidity and body mass index (BMI) of 36.0 to 36.9 in adult, unspecified obesity type ([facility])  Orders - Insulin Pen Needle (BD PEN NEEDLE NANO 2ND GEN) 32G X 4 MM MISC; 1 Package by Does not apply route 2 (two) times daily.  Dispense: 100 each; Refill: 0 - Liraglutide -Weight Management (SAXENDA) 18 MG/3ML SOPN; Inject 0.5 mLs (3 mg total) into the skin daily.  Dispense: 5 pen; Refill: 0  Amber Frost is currently in the action stage of change. As such, her goal is to continue with weight loss efforts. She has agreed to the Category 3 Plan.   Exercise goals: For substantial health benefits, adults should do at least 150 minutes (2 hours and 30 minutes) a week of moderate-intensity, or 75 minutes (1 hour and 15 minutes) a week of vigorous-intensity aerobic physical activity, or an equivalent combination of moderate- and vigorous-intensity aerobic activity. Aerobic activity should be performed in episodes of at least 10 minutes, and preferably, it should be spread throughout the week.  Behavioral modification strategies: increasing water intake.  Amber Frost has agreed to follow-up with our clinic in 2 weeks. She was informed of the importance of frequent follow-up visits to maximize her success with intensive lifestyle modifications for her multiple health conditions.    Objective:   Blood pressure 124/82, pulse 85, temperature 98.3 F (36.8 C), temperature source Oral, height 5\' 4"  (1.626 m), weight 212 lb (96.2 kg), SpO2 95 %. Body mass index is 36.39 kg/m.  General: Cooperative, alert, well developed, in no acute distress. HEENT: Conjunctivae and lids unremarkable. Cardiovascular: Regular rhythm.  Lungs: Normal work of breathing. Neurologic: No focal deficits.   Lab Results  Component Value Date   CREATININE 0.74 06/27/2019   BUN 10 06/27/2019   NA 141 06/27/2019   K 3.9 06/27/2019   CL 98 06/27/2019   CO2 23 06/27/2019   Lab Results  Component Value Date   ALT 23 06/27/2019   AST 23 06/27/2019   ALKPHOS 67 06/27/2019   BILITOT 0.3 06/27/2019   Lab Results  Component Value Date   HGBA1C 5.0 02/28/2019   Lab Results  Component Value Date   INSULIN 13.1 06/27/2019   INSULIN 11.1 02/28/2019   Lab Results  Component Value Date   TSH 1.940 02/28/2019   Lab Results  Component Value Date   CHOL 183 02/28/2019   HDL 71 02/28/2019   LDLCALC 89 02/28/2019   LDLDIRECT 89.0  11/15/2017   TRIG 134 02/28/2019   CHOLHDL 3 11/15/2017   Lab Results  Component Value Date   WBC 4.5 06/27/2019   HGB 14.8 06/27/2019   HCT 41.6 06/27/2019   MCV 96 06/27/2019   PLT 228 06/27/2019   Lab Results  Component Value Date   IRON 87 06/27/2019   TIBC 300 06/27/2019   FERRITIN 256 (H) 06/27/2019   Attestation Statements:   Reviewed by clinician on day of visit: allergies, medications, problem list, medical history, surgical history, family history, social history, and previous encounter notes.  I, Water quality scientist, CMA, am acting as Location manager for [employer], DO.  I have reviewed the above documentation for accuracy and completeness, and I agree with the above. Briscoe Deutscher, DO   Note: Patient does not currently have a PCP, so I am assisting with bridging care. She will call for a new patient appointment this week.

## 2019-10-17 ENCOUNTER — Other Ambulatory Visit: Payer: Self-pay

## 2019-10-17 ENCOUNTER — Other Ambulatory Visit (INDEPENDENT_AMBULATORY_CARE_PROVIDER_SITE_OTHER): Payer: Self-pay | Admitting: *Deleted

## 2019-10-17 ENCOUNTER — Encounter (INDEPENDENT_AMBULATORY_CARE_PROVIDER_SITE_OTHER): Payer: Self-pay | Admitting: Family Medicine

## 2019-10-17 ENCOUNTER — Ambulatory Visit: Payer: BC Managed Care – PPO | Admitting: Dermatology

## 2019-10-17 DIAGNOSIS — L905 Scar conditions and fibrosis of skin: Secondary | ICD-10-CM

## 2019-10-17 DIAGNOSIS — I10 Essential (primary) hypertension: Secondary | ICD-10-CM

## 2019-10-17 MED ORDER — LISINOPRIL 30 MG PO TABS: 30.0000 mg | ORAL_TABLET | Freq: Every day | ORAL | 1 refills | Status: DC

## 2019-10-17 NOTE — Progress Notes (Signed)
   Follow-Up Visit   Subjective  Amber Frost is a 49 y.o. female who presents for the following: Follow-up.  Patient here today to follow up on keloid at right infranasal. She had 1.25mg  kenalog 0.5cc injected at last appointment 09/05/2019. Patient does think scar has improved but probably needs another treatment.   The following portions of the chart were reviewed this encounter and updated as appropriate:  Tobacco  Allergies  Meds  Problems  Med Hx  Surg Hx  Fam Hx     Review of Systems:  No other skin or systemic complaints except as noted in HPI or Assessment and Plan.  Objective  Well appearing patient in no apparent distress; mood and affect are within normal limits.  A focused examination was performed including face. Relevant physical exam findings are noted in the Assessment and Plan.  Objective  Right infranasal: Dyspigmented smooth macule or patch.    Assessment & Plan  Scar, hypertrophic and symptomatic - improving with treatment but not to goal Right infranasal  Lot: [id number] Exp: June 2022  Intralesional injection - Right infranasal Location: right infranasal  Informed Consent: Discussed risks (infection, pain, bleeding, bruising, thinning of the skin, loss of skin pigment, lack of resolution, and recurrence of lesion) and benefits of the procedure, as well as the alternatives. Informed consent was obtained. Preparation: The area was prepared a standard fashion.  Procedure Details: An intralesional injection was performed with Kenalog 2.5 mg/cc. 0.01 cc in total were injected.  Total number of injections: 1  Plan: The patient was instructed on post-op care. Recommend OTC analgesia as needed for pain.   Return in about 2 months (around 12/17/2019) for keloid.  Graciella Belton, RMA, am acting as scribe for Sarina Ser, MD . Documentation: I have reviewed the above documentation for accuracy and completeness, and I agree with the above.  Sarina Ser, MD

## 2019-10-17 NOTE — Telephone Encounter (Signed)
Please advise. Thanks.  

## 2019-10-22 ENCOUNTER — Ambulatory Visit (INDEPENDENT_AMBULATORY_CARE_PROVIDER_SITE_OTHER): Payer: BC Managed Care – PPO | Admitting: Family Medicine

## 2019-10-24 ENCOUNTER — Encounter: Payer: Self-pay | Admitting: Dermatology

## 2019-10-28 ENCOUNTER — Other Ambulatory Visit (INDEPENDENT_AMBULATORY_CARE_PROVIDER_SITE_OTHER): Payer: Self-pay | Admitting: Family Medicine

## 2019-10-28 DIAGNOSIS — F5104 Psychophysiologic insomnia: Secondary | ICD-10-CM

## 2019-11-01 ENCOUNTER — Other Ambulatory Visit (INDEPENDENT_AMBULATORY_CARE_PROVIDER_SITE_OTHER): Payer: Self-pay | Admitting: Family Medicine

## 2019-11-01 DIAGNOSIS — F419 Anxiety disorder, unspecified: Secondary | ICD-10-CM

## 2019-11-06 ENCOUNTER — Other Ambulatory Visit (INDEPENDENT_AMBULATORY_CARE_PROVIDER_SITE_OTHER): Payer: Self-pay | Admitting: Family Medicine

## 2019-11-06 DIAGNOSIS — E559 Vitamin D deficiency, unspecified: Secondary | ICD-10-CM

## 2019-11-08 ENCOUNTER — Other Ambulatory Visit: Payer: Self-pay | Admitting: Family

## 2019-11-12 ENCOUNTER — Encounter (INDEPENDENT_AMBULATORY_CARE_PROVIDER_SITE_OTHER): Payer: Self-pay | Admitting: Family Medicine

## 2019-11-12 ENCOUNTER — Other Ambulatory Visit: Payer: Self-pay

## 2019-11-12 ENCOUNTER — Ambulatory Visit (INDEPENDENT_AMBULATORY_CARE_PROVIDER_SITE_OTHER): Payer: BC Managed Care – PPO | Admitting: Family Medicine

## 2019-11-12 VITALS — BP 131/80 | HR 95 | Temp 98.6°F | Ht 64.0 in | Wt 215.0 lb

## 2019-11-12 DIAGNOSIS — G4709 Other insomnia: Secondary | ICD-10-CM | POA: Diagnosis not present

## 2019-11-12 DIAGNOSIS — Z9189 Other specified personal risk factors, not elsewhere classified: Secondary | ICD-10-CM

## 2019-11-12 DIAGNOSIS — I10 Essential (primary) hypertension: Secondary | ICD-10-CM

## 2019-11-12 DIAGNOSIS — F419 Anxiety disorder, unspecified: Secondary | ICD-10-CM

## 2019-11-12 DIAGNOSIS — E559 Vitamin D deficiency, unspecified: Secondary | ICD-10-CM

## 2019-11-12 DIAGNOSIS — Z6837 Body mass index (BMI) 37.0-37.9, adult: Secondary | ICD-10-CM

## 2019-11-12 DIAGNOSIS — E66812 Obesity, class 2: Secondary | ICD-10-CM

## 2019-11-13 MED ORDER — FLUOXETINE HCL 20 MG PO TABS: 20.0000 mg | ORAL_TABLET | Freq: Every day | ORAL | 0 refills | Status: DC

## 2019-11-13 MED ORDER — VITAMIN D (ERGOCALCIFEROL) 1.25 MG (50000 UNIT) PO CAPS: 50000.0000 [IU] | ORAL_CAPSULE | ORAL | 0 refills | Status: DC

## 2019-11-13 NOTE — Progress Notes (Signed)
Chief Complaint:   OBESITY Amber Frost is here to discuss her progress with her obesity treatment plan along with follow-up of her obesity related diagnoses. Amber Frost is on the Category 3 Plan and states she is following her eating plan approximately 70% of the time. Amber Frost states she is exercising for 0 minutes 0 times per week.  Today's visit was #: 11 Starting weight: 226 lbs Starting date: 02/28/2019 Today's weight: 215 lbs Today's date: 11/12/2019 Total lbs lost to date: 11 lbs Total lbs lost since last in-office visit: 0  Interim History: Amber Frost still has not gotten a PCP.  She says that her work stress has increased again.  She went to the beach and had a good time.  Subjective:   1. Essential hypertension Review: taking medications as instructed, no medication side effects noted, no chest pain on exertion, no dyspnea on exertion, no swelling of ankles.   BP Readings from Last 3 Encounters:  11/12/19 131/80  10/03/19 124/82  09/03/19 127/79   2. Vitamin D deficiency Amber Frost's Vitamin D level was 60.3 on 06/27/2019. She is currently taking prescription vitamin D 50,000 IU each week. She denies nausea, vomiting or muscle weakness.  3. Other insomnia Amber Frost is taking Restoril to help her sleep.  She says it works for her.  4. Anxiety Amber Frost takes Prozac 20 mg daily for anxiety.  5. At risk for heart disease Amber Frost is at a higher than average risk for cardiovascular disease due to obesity.   Assessment/Plan:   1. Essential hypertension Amber Frost is working on healthy weight loss and exercise to improve blood pressure control. We will watch for signs of hypotension as she continues her lifestyle modifications.  2. Vitamin D deficiency Low Vitamin D level contributes to fatigue and are associated with obesity, breast, and colon cancer. She agrees to continue to take prescription Vitamin D @50 ,000 IU every week and will follow-up for routine testing of Vitamin D, at least 2-3  times per year to avoid over-replacement.  Orders - Vitamin D, Ergocalciferol, (DRISDOL) 1.25 MG (50000 UNIT) CAPS capsule; Take 1 capsule (50,000 Units total) by mouth every 14 (fourteen) days.  Dispense: 2 capsule; Refill: 0  3. Other insomnia The problem of recurrent insomnia was discussed. Orders and follow up as documented in patient record. Counseling: Intensive lifestyle modifications are the first line treatment for this issue. We discussed several lifestyle modifications today and she will continue to work on diet, exercise and weight loss efforts.   Counseling  Limit or avoid alcohol, caffeinated beverages, and cigarettes, especially close to bedtime.   Do not eat a large meal or eat spicy foods right before bedtime. This can lead to digestive discomfort that can make it hard for you to sleep.  Keep a sleep diary to help you and your health care provider figure out what could be causing your insomnia.  . Make your bedroom a dark, comfortable place where it is easy to fall asleep. ? Put up shades or blackout curtains to block light from outside. ? Use a white noise machine to block noise. ? Keep the temperature cool. . Limit screen use before bedtime. This includes: ? Watching TV. ? Using your smartphone, tablet, or computer. . Stick to a routine that includes going to bed and waking up at the same times every day and night. This can help you fall asleep faster. Consider making a quiet activity, such as reading, part of your nighttime routine. . Try to avoid taking naps  during the day so that you sleep better at night. . Get out of bed if you are still awake after 15 minutes of trying to sleep. Keep the lights down, but try reading or doing a quiet activity. When you feel sleepy, go back to bed.  4. Anxiety Behavior modification techniques were discussed today to help Amber Frost deal with her anxiety.  Orders and follow up as documented in patient record.   Orders - FLUoxetine  (PROZAC) 20 MG tablet; Take 1 tablet (20 mg total) by mouth daily.  Dispense: 90 tablet; Refill: 0  5. At risk for heart disease Amber Frost was given approximately 15 minutes of coronary artery disease prevention counseling today. She is 49 y.o. female and has risk factors for heart disease including obesity. We discussed intensive lifestyle modifications today with an emphasis on specific weight loss instructions and strategies.   Repetitive spaced learning was employed today to elicit superior memory formation and behavioral change.  6. Class 2 severe obesity with serious comorbidity and body mass index (BMI) of 37.0 to 37.9 in adult, unspecified obesity type (HCC) Amber Frost is currently in the action stage of change. As such, her goal is to continue with weight loss efforts. She has agreed to the Category 3 Plan.   Exercise goals: For substantial health benefits, adults should do at least 150 minutes (2 hours and 30 minutes) a week of moderate-intensity, or 75 minutes (1 hour and 15 minutes) a week of vigorous-intensity aerobic physical activity, or an equivalent combination of moderate- and vigorous-intensity aerobic activity. Aerobic activity should be performed in episodes of at least 10 minutes, and preferably, it should be spread throughout the week.  Behavioral modification strategies: increasing lean protein intake.  Amber Frost has agreed to follow-up with our clinic in 3 weeks. She was informed of the importance of frequent follow-up visits to maximize her success with intensive lifestyle modifications for her multiple health conditions.   Objective:   Blood pressure 131/80, pulse 95, temperature 98.6 F (37 C), temperature source Oral, height 5\' 4"  (1.626 m), weight 215 lb (97.5 kg), SpO2 97 %. Body mass index is 36.9 kg/m.  General: Cooperative, alert, well developed, in no acute distress. HEENT: Conjunctivae and lids unremarkable. Cardiovascular: Regular rhythm.  Lungs: Normal work of  breathing. Neurologic: No focal deficits.   Lab Results  Component Value Date   CREATININE 0.74 06/27/2019   BUN 10 06/27/2019   NA 141 06/27/2019   K 3.9 06/27/2019   CL 98 06/27/2019   CO2 23 06/27/2019   Lab Results  Component Value Date   ALT 23 06/27/2019   AST 23 06/27/2019   ALKPHOS 67 06/27/2019   BILITOT 0.3 06/27/2019   Lab Results  Component Value Date   HGBA1C 5.0 02/28/2019   Lab Results  Component Value Date   INSULIN 13.1 06/27/2019   INSULIN 11.1 02/28/2019   Lab Results  Component Value Date   TSH 1.940 02/28/2019   Lab Results  Component Value Date   CHOL 183 02/28/2019   HDL 71 02/28/2019   LDLCALC 89 02/28/2019   LDLDIRECT 89.0 11/15/2017   TRIG 134 02/28/2019   CHOLHDL 3 11/15/2017   Lab Results  Component Value Date   WBC 4.5 06/27/2019   HGB 14.8 06/27/2019   HCT 41.6 06/27/2019   MCV 96 06/27/2019   PLT 228 06/27/2019   Lab Results  Component Value Date   IRON 87 06/27/2019   TIBC 300 06/27/2019   FERRITIN 256 (H) 06/27/2019  Attestation Statements:   Reviewed by clinician on day of visit: allergies, medications, problem list, medical history, surgical history, family history, social history, and previous encounter notes.  I, Water quality scientist, CMA, am acting as transcriptionist for Briscoe Deutscher, DO  I have reviewed the above documentation for accuracy and completeness, and I agree with the above. Briscoe Deutscher, DO

## 2019-11-26 ENCOUNTER — Other Ambulatory Visit (INDEPENDENT_AMBULATORY_CARE_PROVIDER_SITE_OTHER): Payer: Self-pay | Admitting: Family Medicine

## 2019-11-26 ENCOUNTER — Encounter (INDEPENDENT_AMBULATORY_CARE_PROVIDER_SITE_OTHER): Payer: Self-pay | Admitting: Family Medicine

## 2019-11-28 ENCOUNTER — Encounter (INDEPENDENT_AMBULATORY_CARE_PROVIDER_SITE_OTHER): Payer: Self-pay

## 2019-11-28 ENCOUNTER — Other Ambulatory Visit (INDEPENDENT_AMBULATORY_CARE_PROVIDER_SITE_OTHER): Payer: Self-pay | Admitting: Family Medicine

## 2019-11-28 DIAGNOSIS — F5104 Psychophysiologic insomnia: Secondary | ICD-10-CM

## 2019-11-28 NOTE — Telephone Encounter (Signed)
My chart message sent to pt.

## 2019-12-04 ENCOUNTER — Ambulatory Visit (INDEPENDENT_AMBULATORY_CARE_PROVIDER_SITE_OTHER): Payer: BC Managed Care – PPO | Admitting: Family Medicine

## 2019-12-04 ENCOUNTER — Other Ambulatory Visit: Payer: Self-pay

## 2019-12-04 ENCOUNTER — Encounter (INDEPENDENT_AMBULATORY_CARE_PROVIDER_SITE_OTHER): Payer: Self-pay | Admitting: Family Medicine

## 2019-12-04 VITALS — BP 154/84 | HR 75 | Temp 97.9°F | Ht 64.0 in | Wt 213.0 lb

## 2019-12-04 DIAGNOSIS — F418 Other specified anxiety disorders: Secondary | ICD-10-CM | POA: Diagnosis not present

## 2019-12-04 DIAGNOSIS — Z9189 Other specified personal risk factors, not elsewhere classified: Secondary | ICD-10-CM

## 2019-12-04 DIAGNOSIS — Z6836 Body mass index (BMI) 36.0-36.9, adult: Secondary | ICD-10-CM

## 2019-12-04 DIAGNOSIS — J302 Other seasonal allergic rhinitis: Secondary | ICD-10-CM | POA: Diagnosis not present

## 2019-12-04 DIAGNOSIS — I10 Essential (primary) hypertension: Secondary | ICD-10-CM

## 2019-12-04 DIAGNOSIS — G4709 Other insomnia: Secondary | ICD-10-CM

## 2019-12-04 MED ORDER — CETIRIZINE HCL 10 MG PO TABS: 10.0000 mg | ORAL_TABLET | Freq: Every day | ORAL | 0 refills | Status: AC

## 2019-12-04 MED ORDER — TEMAZEPAM 15 MG PO CAPS: 15.0000 mg | ORAL_CAPSULE | Freq: Every evening | ORAL | 2 refills | Status: DC | PRN

## 2019-12-04 MED ORDER — CHLORTHALIDONE 25 MG PO TABS: 25.0000 mg | ORAL_TABLET | Freq: Every day | ORAL | 3 refills | Status: DC

## 2019-12-04 MED ORDER — FLUOXETINE HCL 20 MG PO TABS: 20.0000 mg | ORAL_TABLET | Freq: Every day | ORAL | 3 refills | Status: DC

## 2019-12-04 NOTE — Progress Notes (Signed)
Chief Complaint:   OBESITY Amber Frost is here to discuss her progress with her obesity treatment plan along with follow-up of her obesity related diagnoses. Amber Frost is on the Category 3 Plan and states she is following her eating plan approximately 60% of the time. Amber Frost states she is walking 8,000 steps 5 times per week.  Today's visit was #: 12 Starting weight: 226 lbs Starting date: 02/28/2019 Today's weight: 213 lbs Today's date: 12/04/2019 Total lbs lost to date: 13 lbs Total lbs lost since last in-office visit: 2 lbs  Interim History: Amber Frost is down 13 pounds today.  She is trying to get into [facility] for her primary care.  She says she will be closing on her home on July 14th.  She will fast for labs at her next visit.  Subjective:   1. Essential hypertension Review: taking medications as instructed, no medication side effects noted, no chest pain on exertion, no dyspnea on exertion, no swelling of ankles.  She ran out of chlorthalidone.  BP Readings from Last 3 Encounters:  12/04/19 (!) 154/84  11/12/19 131/80  10/03/19 124/82   2. Other insomnia Eri has difficulty sleeping and takes temazepam 7.5 mg nightly as needed. She still wakes in the middle of the night.  3. Seasonal allergies Dorita has seasonal allergies.  4. Situational anxiety Charissa takes Prozac 20 mg daily for anxiety.  Assessment/Plan:   1. Essential hypertension Afsana is working on healthy weight loss and exercise to improve blood pressure control. We will watch for signs of hypotension as she continues her lifestyle modifications.  Will refill chlorthalidone, as per below.  Orders - chlorthalidone (HYGROTON) 25 MG tablet; Take 1 tablet (25 mg total) by mouth daily.  Dispense: 90 tablet; Refill: 3  2. Other insomnia Will refill temazepam today, as per below.  Orders - temazepam (RESTORIL) 15 MG capsule; Take 1 capsule (15 mg total) by mouth at bedtime as needed for sleep.  Dispense:  30 capsule; Refill: 2  3. Seasonal allergies Will send in cetirizine, as per below.  Orders - cetirizine (ZYRTEC ALLERGY) 10 MG tablet; Take 1 tablet (10 mg total) by mouth daily.  Dispense: 90 tablet; Refill: 0  4. Situational anxiety Behavior modification techniques were discussed today to help Amber Frost deal with her anxiety.  Orders and follow up as documented in patient record.   Orders - FLUoxetine (PROZAC) 20 MG tablet; Take 1 tablet (20 mg total) by mouth daily.  Dispense: 90 tablet; Refill: 3  5. At risk for heart disease Amber Frost was given approximately 15 minutes of coronary artery disease prevention counseling today. She is 49 y.o. female and has risk factors for heart disease including obesity. We discussed intensive lifestyle modifications today with an emphasis on specific weight loss instructions and strategies.   Repetitive spaced learning was employed today to elicit superior memory formation and behavioral change.  6. Class 2 severe obesity with serious comorbidity and body mass index (BMI) of 36.0 to 36.9 in adult, unspecified obesity type (HCC) Amber Frost is currently in the action stage of change. As such, her goal is to continue with weight loss efforts. She has agreed to the Category 3 Plan.   Exercise goals: For substantial health benefits, adults should do at least 150 minutes (2 hours and 30 minutes) a week of moderate-intensity, or 75 minutes (1 hour and 15 minutes) a week of vigorous-intensity aerobic physical activity, or an equivalent combination of moderate- and vigorous-intensity aerobic activity. Aerobic activity should be  performed in episodes of at least 10 minutes, and preferably, it should be spread throughout the week.  Behavioral modification strategies: increasing lean protein intake.  Amber Frost has agreed to follow-up with our clinic in 3 weeks, fasting. She was informed of the importance of frequent follow-up visits to maximize her success with intensive  lifestyle modifications for her multiple health conditions.   Objective:   Blood pressure (!) 154/84, pulse 75, temperature 97.9 F (36.6 C), temperature source Oral, height 5\' 4"  (1.626 m), weight 213 lb (96.6 kg), SpO2 99 %. Body mass index is 36.56 kg/m.  General: Cooperative, alert, well developed, in no acute distress. HEENT: Conjunctivae and lids unremarkable. Cardiovascular: Regular rhythm.  Lungs: Normal work of breathing. Neurologic: No focal deficits.   Lab Results  Component Value Date   CREATININE 0.74 06/27/2019   BUN 10 06/27/2019   NA 141 06/27/2019   K 3.9 06/27/2019   CL 98 06/27/2019   CO2 23 06/27/2019   Lab Results  Component Value Date   ALT 23 06/27/2019   AST 23 06/27/2019   ALKPHOS 67 06/27/2019   BILITOT 0.3 06/27/2019   Lab Results  Component Value Date   HGBA1C 5.0 02/28/2019   Lab Results  Component Value Date   INSULIN 13.1 06/27/2019   INSULIN 11.1 02/28/2019   Lab Results  Component Value Date   TSH 1.940 02/28/2019   Lab Results  Component Value Date   CHOL 183 02/28/2019   HDL 71 02/28/2019   LDLCALC 89 02/28/2019   LDLDIRECT 89.0 11/15/2017   TRIG 134 02/28/2019   CHOLHDL 3 11/15/2017   Lab Results  Component Value Date   WBC 4.5 06/27/2019   HGB 14.8 06/27/2019   HCT 41.6 06/27/2019   MCV 96 06/27/2019   PLT 228 06/27/2019   Lab Results  Component Value Date   IRON 87 06/27/2019   TIBC 300 06/27/2019   FERRITIN 256 (H) 06/27/2019   Attestation Statements:   Reviewed by clinician on day of visit: allergies, medications, problem list, medical history, surgical history, family history, social history, and previous encounter notes.  I, Water quality scientist, CMA, am acting as transcriptionist for Briscoe Deutscher, DO  I have reviewed the above documentation for accuracy and completeness, and I agree with the above. Briscoe Deutscher, DO

## 2019-12-20 ENCOUNTER — Encounter (INDEPENDENT_AMBULATORY_CARE_PROVIDER_SITE_OTHER): Payer: Self-pay

## 2019-12-20 ENCOUNTER — Other Ambulatory Visit (INDEPENDENT_AMBULATORY_CARE_PROVIDER_SITE_OTHER): Payer: Self-pay | Admitting: Family Medicine

## 2019-12-20 DIAGNOSIS — E559 Vitamin D deficiency, unspecified: Secondary | ICD-10-CM

## 2019-12-20 NOTE — Telephone Encounter (Signed)
My chart message sent to pt.

## 2019-12-26 ENCOUNTER — Encounter (INDEPENDENT_AMBULATORY_CARE_PROVIDER_SITE_OTHER): Payer: Self-pay | Admitting: Family Medicine

## 2019-12-26 ENCOUNTER — Other Ambulatory Visit: Payer: Self-pay

## 2019-12-26 ENCOUNTER — Ambulatory Visit (INDEPENDENT_AMBULATORY_CARE_PROVIDER_SITE_OTHER): Payer: BC Managed Care – PPO | Admitting: Family Medicine

## 2019-12-26 VITALS — BP 116/80 | HR 86 | Temp 98.1°F | Ht 64.0 in | Wt 216.0 lb

## 2019-12-26 DIAGNOSIS — F900 Attention-deficit hyperactivity disorder, predominantly inattentive type: Secondary | ICD-10-CM | POA: Insufficient documentation

## 2019-12-26 DIAGNOSIS — Z9189 Other specified personal risk factors, not elsewhere classified: Secondary | ICD-10-CM

## 2019-12-26 DIAGNOSIS — F909 Attention-deficit hyperactivity disorder, unspecified type: Secondary | ICD-10-CM | POA: Diagnosis not present

## 2019-12-26 DIAGNOSIS — E66812 Obesity, class 2: Secondary | ICD-10-CM

## 2019-12-26 DIAGNOSIS — Z6837 Body mass index (BMI) 37.0-37.9, adult: Secondary | ICD-10-CM

## 2019-12-26 DIAGNOSIS — E559 Vitamin D deficiency, unspecified: Secondary | ICD-10-CM | POA: Diagnosis not present

## 2019-12-26 DIAGNOSIS — I10 Essential (primary) hypertension: Secondary | ICD-10-CM

## 2019-12-26 DIAGNOSIS — G4709 Other insomnia: Secondary | ICD-10-CM | POA: Diagnosis not present

## 2019-12-26 MED ORDER — SAXENDA 18 MG/3ML ~~LOC~~ SOPN: 3.0000 mg | PEN_INJECTOR | Freq: Every day | SUBCUTANEOUS | 0 refills | Status: DC

## 2019-12-26 MED ORDER — VITAMIN D (ERGOCALCIFEROL) 1.25 MG (50000 UNIT) PO CAPS: 50000.0000 [IU] | ORAL_CAPSULE | ORAL | 0 refills | Status: DC

## 2019-12-26 MED ORDER — BD PEN NEEDLE NANO 2ND GEN 32G X 4 MM MISC: 1.0000 | Freq: Every day | 0 refills | Status: DC

## 2019-12-26 NOTE — Progress Notes (Signed)
Chief Complaint:   OBESITY Amber Frost is here to discuss her progress with her obesity treatment plan along with follow-up of her obesity related diagnoses. Amber Frost is on the Category 3 Plan and states she is following her eating plan approximately 60% of the time. Amber Frost states she is walking for 30 minutes 3 times per week.  Today's visit was #: 51 Starting weight: 226 lbs Starting date: 02/28/2019 Today's weight: 216 lbs Today's date: 12/26/2019 Total lbs lost to date: 10 lbs Total lbs lost since last in-office visit: 0  Interim History: Amber Frost says she has established with a new PCP.  Medication changes were discussed.  She reports feeling good.  Her stress level is stable.  She is interested in the [employer].  Subjective:   1. Essential hypertension Review: taking medications as instructed, no medication side effects noted, no chest pain on exertion, no dyspnea on exertion, no swelling of ankles.  Blood pressure medication has been changed to Benicar-HCTZ.  Blood pressure is at goal.  BP Readings from Last 3 Encounters:  12/26/19 116/80  12/04/19 (!) 154/84  11/12/19 131/80   2. Vitamin D deficiency Amber Frost's Vitamin D level was 60.3 on 06/27/2019. She is currently taking prescription vitamin D 50,000 IU each week. She denies nausea, vomiting or muscle weakness.  3. Other insomnia Amber Frost suffers from insomnia.  Her medication has been changed to Ambien 5 mg at bedtime as needed.  She says it is helping.    4. Attention deficit hyperactivity disorder (ADHD), unspecified ADHD type Amber Frost is followed by [employer] [facility].  She is taking Vyvanse 30 mg daily and doing well.  5. At risk for heart disease Amber Frost is at a higher than average risk for cardiovascular disease due to obesity.   Assessment/Plan:   1. Essential hypertension Amber Frost is working on healthy weight loss and exercise to improve blood pressure control. We will watch for signs of hypotension  as she continues her lifestyle modifications.  2. Vitamin D deficiency Low Vitamin D level contributes to fatigue and are associated with obesity, breast, and colon cancer. She agrees to continue to take prescription Vitamin D @50 ,000 IU every week and will follow-up for routine testing of Vitamin D, at least 2-3 times per year to avoid over-replacement.  - Vitamin D, Ergocalciferol, (DRISDOL) 1.25 MG (50000 UNIT) CAPS capsule; Take 1 capsule (50,000 Units total) by mouth every 14 (fourteen) days.  Dispense: 2 capsule; Refill: 0  3. Other insomnia The problem of recurrent insomnia was discussed. Orders and follow up as documented in patient record. Counseling: Intensive lifestyle modifications are the first line treatment for this issue. We discussed several lifestyle modifications today and she will continue to work on diet, exercise and weight loss efforts.  Will monitor for nighttime eating.   Counseling  Limit or avoid alcohol, caffeinated beverages, and cigarettes, especially close to bedtime.   Do not eat a large meal or eat spicy foods right before bedtime. This can lead to digestive discomfort that can make it hard for you to sleep.  Keep a sleep diary to help you and your health care provider figure out what could be causing your insomnia.  . Make your bedroom a dark, comfortable place where it is easy to fall asleep. ? Put up shades or blackout curtains to block light from outside. ? Use a white noise machine to block noise. ? Keep the temperature cool. . Limit screen use before bedtime. This includes: ? Watching TV. ?  Using your smartphone, tablet, or computer. . Stick to a routine that includes going to bed and waking up at the same times every day and night. This can help you fall asleep faster. Consider making a quiet activity, such as reading, part of your nighttime routine. . Try to avoid taking naps during the day so that you sleep better at night. . Get out of bed if you  are still awake after 15 minutes of trying to sleep. Keep the lights down, but try reading or doing a quiet activity. When you feel sleepy, go back to bed.  4. Attention deficit hyperactivity disorder (ADHD), unspecified ADHD type Followed by [facility] for this problem. Those encounter notes were reviewed.  5. At risk for heart disease Amber Frost was given approximately 15 minutes of coronary artery disease prevention counseling today. She is 49 y.o. female and has risk factors for heart disease including obesity. We discussed intensive lifestyle modifications today with an emphasis on specific weight loss instructions and strategies.   Repetitive spaced learning was employed today to elicit superior memory formation and behavioral change.  6. Class 2 severe obesity with serious comorbidity and body mass index (BMI) of 37.0 to 37.9 in adult, unspecified obesity type (HCC)  - Liraglutide -Weight Management (SAXENDA) 18 MG/3ML SOPN; Inject 0.5 mLs (3 mg total) into the skin daily.  Dispense: 5 pen; Refill: 0 - Insulin Pen Needle (BD PEN NEEDLE NANO 2ND GEN) 32G X 4 MM MISC; 1 Package by Does not apply route daily.  Dispense: 100 each; Refill: 0  Amber Frost is currently in the action stage of change. As such, her goal is to continue with weight loss efforts. She has agreed to the [employer].   Exercise goals: For substantial health benefits, adults should do at least 150 minutes (2 hours and 30 minutes) a week of moderate-intensity, or 75 minutes (1 hour and 15 minutes) a week of vigorous-intensity aerobic physical activity, or an equivalent combination of moderate- and vigorous-intensity aerobic activity. Aerobic activity should be performed in episodes of at least 10 minutes, and preferably, it should be spread throughout the week.  Behavioral modification strategies: increasing lean protein intake, decreasing sodium intake and increasing high fiber foods.  Amber Frost has agreed to  follow-up with our clinic in 3 weeks. She was informed of the importance of frequent follow-up visits to maximize her success with intensive lifestyle modifications for her multiple health conditions.   Objective:   Blood pressure 116/80, pulse 86, temperature 98.1 F (36.7 C), temperature source Oral, height 5\' 4"  (1.626 m), weight 216 lb (98 kg), SpO2 98 %. Body mass index is 37.08 kg/m.  General: Cooperative, alert, well developed, in no acute distress. HEENT: Conjunctivae and lids unremarkable. Cardiovascular: Regular rhythm.  Lungs: Normal work of breathing. Neurologic: No focal deficits.   Lab Results  Component Value Date   CREATININE 0.74 06/27/2019   BUN 10 06/27/2019   NA 141 06/27/2019   K 3.9 06/27/2019   CL 98 06/27/2019   CO2 23 06/27/2019   Lab Results  Component Value Date   ALT 23 06/27/2019   AST 23 06/27/2019   ALKPHOS 67 06/27/2019   BILITOT 0.3 06/27/2019   Lab Results  Component Value Date   HGBA1C 5.0 02/28/2019   Lab Results  Component Value Date   INSULIN 13.1 06/27/2019   INSULIN 11.1 02/28/2019   Lab Results  Component Value Date   TSH 1.940 02/28/2019   Lab Results  Component Value  Date   CHOL 183 02/28/2019   HDL 71 02/28/2019   LDLCALC 89 02/28/2019   LDLDIRECT 89.0 11/15/2017   TRIG 134 02/28/2019   CHOLHDL 3 11/15/2017   Lab Results  Component Value Date   WBC 4.5 06/27/2019   HGB 14.8 06/27/2019   HCT 41.6 06/27/2019   MCV 96 06/27/2019   PLT 228 06/27/2019   Lab Results  Component Value Date   IRON 87 06/27/2019   TIBC 300 06/27/2019   FERRITIN 256 (H) 06/27/2019   Attestation Statements:   Reviewed by clinician on day of visit: allergies, medications, problem list, medical history, surgical history, family history, social history, and previous encounter notes.  I, Water quality scientist, CMA, am acting as transcriptionist for Briscoe Deutscher, DO  I have reviewed the above documentation for accuracy and completeness, and I  agree with the above. Briscoe Deutscher, DO

## 2020-01-04 ENCOUNTER — Other Ambulatory Visit (INDEPENDENT_AMBULATORY_CARE_PROVIDER_SITE_OTHER): Payer: Self-pay | Admitting: Family Medicine

## 2020-01-04 DIAGNOSIS — F5104 Psychophysiologic insomnia: Secondary | ICD-10-CM

## 2020-01-14 ENCOUNTER — Encounter (INDEPENDENT_AMBULATORY_CARE_PROVIDER_SITE_OTHER): Payer: Self-pay

## 2020-01-16 ENCOUNTER — Encounter (INDEPENDENT_AMBULATORY_CARE_PROVIDER_SITE_OTHER): Payer: Self-pay

## 2020-01-17 ENCOUNTER — Ambulatory Visit (INDEPENDENT_AMBULATORY_CARE_PROVIDER_SITE_OTHER): Payer: BC Managed Care – PPO | Admitting: Family Medicine

## 2020-01-31 ENCOUNTER — Other Ambulatory Visit (INDEPENDENT_AMBULATORY_CARE_PROVIDER_SITE_OTHER): Payer: Self-pay | Admitting: Family Medicine

## 2020-01-31 ENCOUNTER — Encounter (INDEPENDENT_AMBULATORY_CARE_PROVIDER_SITE_OTHER): Payer: Self-pay

## 2020-01-31 DIAGNOSIS — F419 Anxiety disorder, unspecified: Secondary | ICD-10-CM

## 2020-01-31 NOTE — Telephone Encounter (Signed)
My chart message sent to pt.

## 2020-02-21 ENCOUNTER — Ambulatory Visit: Payer: BC Managed Care – PPO | Admitting: Dermatology

## 2020-02-21 ENCOUNTER — Other Ambulatory Visit: Payer: Self-pay

## 2020-02-21 DIAGNOSIS — L570 Actinic keratosis: Secondary | ICD-10-CM | POA: Diagnosis not present

## 2020-02-21 DIAGNOSIS — L700 Acne vulgaris: Secondary | ICD-10-CM

## 2020-02-21 DIAGNOSIS — T148XXA Other injury of unspecified body region, initial encounter: Secondary | ICD-10-CM

## 2020-02-21 NOTE — Patient Instructions (Signed)

## 2020-02-21 NOTE — Progress Notes (Signed)
   Follow-Up Visit   Subjective  Amber Frost is a 49 y.o. female who presents for the following: Other (Spot of upper lip x 1 month that will not go away. Spots on arms and back that are irritating.).  The following portions of the chart were reviewed this encounter and updated as appropriate:  Tobacco  Allergies  Meds  Problems  Med Hx  Surg Hx  Fam Hx     Review of Systems:  No other skin or systemic complaints except as noted in HPI or Assessment and Plan.  Objective  Well appearing patient in no apparent distress; mood and affect are within normal limits.  A focused examination was performed including face, arms, back. Relevant physical exam findings are noted in the Assessment and Plan.  Objective  Left upper lip vermillion border x 1, Right Forearm - Posterior: 0.6 x 0.5 cm scaly pink papule of left upper lip vermillion border. Scaly pink papule of right forearm.  Objective  Left Upper Back: Pink papule.  Objective  Left Forearm - Posterior: Excoriation   Assessment & Plan  AK (actinic keratosis) (2) Right Forearm - Posterior; Left upper lip vermillion border x 1  Recheck left upper lip on follow up. May consider biopsy if not resolved.  Destruction of lesion - Left upper lip vermillion border x 1 Complexity: simple   Destruction method: cryotherapy   Informed consent: discussed and consent obtained   Timeout:  patient name, date of birth, surgical site, and procedure verified Lesion destroyed using liquid nitrogen: Yes   Region frozen until ice ball extended beyond lesion: Yes   Outcome: patient tolerated procedure well with no complications   Post-procedure details: wound care instructions given    Acne vulgaris Left Upper Back  Benign. Recheck on follow up.  Excoriation Left Forearm - Posterior  Recheck on follow up.  Return for AK follow up in 6-8 weeks.   I, Ashok Cordia, CMA, am acting as scribe for Sarina Ser, MD .  Documentation: I  have reviewed the above documentation for accuracy and completeness, and I agree with the above.  Sarina Ser, MD

## 2020-02-24 ENCOUNTER — Other Ambulatory Visit (INDEPENDENT_AMBULATORY_CARE_PROVIDER_SITE_OTHER): Payer: Self-pay | Admitting: Family Medicine

## 2020-02-24 DIAGNOSIS — F3289 Other specified depressive episodes: Secondary | ICD-10-CM

## 2020-02-25 ENCOUNTER — Encounter: Payer: Self-pay | Admitting: Dermatology

## 2020-02-27 ENCOUNTER — Other Ambulatory Visit (INDEPENDENT_AMBULATORY_CARE_PROVIDER_SITE_OTHER): Payer: Self-pay | Admitting: Family Medicine

## 2020-02-27 DIAGNOSIS — F5104 Psychophysiologic insomnia: Secondary | ICD-10-CM

## 2020-02-29 ENCOUNTER — Other Ambulatory Visit: Payer: Self-pay | Admitting: Dermatology

## 2020-03-27 ENCOUNTER — Other Ambulatory Visit (INDEPENDENT_AMBULATORY_CARE_PROVIDER_SITE_OTHER): Payer: Self-pay | Admitting: Family Medicine

## 2020-03-27 DIAGNOSIS — E559 Vitamin D deficiency, unspecified: Secondary | ICD-10-CM

## 2020-04-01 ENCOUNTER — Other Ambulatory Visit (INDEPENDENT_AMBULATORY_CARE_PROVIDER_SITE_OTHER): Payer: Self-pay | Admitting: Family Medicine

## 2020-04-01 DIAGNOSIS — Z6837 Body mass index (BMI) 37.0-37.9, adult: Secondary | ICD-10-CM

## 2020-04-01 NOTE — Telephone Encounter (Signed)
Dr.Wallace's patient

## 2020-04-04 ENCOUNTER — Ambulatory Visit: Payer: BC Managed Care – PPO | Admitting: Podiatry

## 2020-04-08 ENCOUNTER — Other Ambulatory Visit (INDEPENDENT_AMBULATORY_CARE_PROVIDER_SITE_OTHER): Payer: Self-pay | Admitting: Family Medicine

## 2020-04-08 DIAGNOSIS — E559 Vitamin D deficiency, unspecified: Secondary | ICD-10-CM

## 2020-04-10 ENCOUNTER — Other Ambulatory Visit (INDEPENDENT_AMBULATORY_CARE_PROVIDER_SITE_OTHER): Payer: Self-pay | Admitting: Family Medicine

## 2020-04-10 ENCOUNTER — Ambulatory Visit: Payer: BC Managed Care – PPO | Admitting: Dermatology

## 2020-04-10 DIAGNOSIS — F3289 Other specified depressive episodes: Secondary | ICD-10-CM

## 2020-04-10 DIAGNOSIS — F5104 Psychophysiologic insomnia: Secondary | ICD-10-CM

## 2020-04-14 ENCOUNTER — Other Ambulatory Visit (INDEPENDENT_AMBULATORY_CARE_PROVIDER_SITE_OTHER): Payer: Self-pay | Admitting: Family Medicine

## 2020-04-14 DIAGNOSIS — E559 Vitamin D deficiency, unspecified: Secondary | ICD-10-CM

## 2020-04-15 NOTE — Telephone Encounter (Signed)
Last seen by Dr. Wallace. 

## 2020-04-16 ENCOUNTER — Other Ambulatory Visit (INDEPENDENT_AMBULATORY_CARE_PROVIDER_SITE_OTHER): Payer: Self-pay | Admitting: Family Medicine

## 2020-05-03 ENCOUNTER — Other Ambulatory Visit (INDEPENDENT_AMBULATORY_CARE_PROVIDER_SITE_OTHER): Payer: Self-pay | Admitting: Family Medicine

## 2020-05-03 DIAGNOSIS — F3289 Other specified depressive episodes: Secondary | ICD-10-CM

## 2020-05-03 DIAGNOSIS — Z6837 Body mass index (BMI) 37.0-37.9, adult: Secondary | ICD-10-CM

## 2020-05-03 DIAGNOSIS — E559 Vitamin D deficiency, unspecified: Secondary | ICD-10-CM

## 2020-05-05 NOTE — Telephone Encounter (Signed)
Last OV with Dr Wallace 

## 2020-05-07 ENCOUNTER — Ambulatory Visit (INDEPENDENT_AMBULATORY_CARE_PROVIDER_SITE_OTHER): Payer: BC Managed Care – PPO | Admitting: Family Medicine

## 2020-05-07 ENCOUNTER — Encounter (INDEPENDENT_AMBULATORY_CARE_PROVIDER_SITE_OTHER): Payer: Self-pay | Admitting: Family Medicine

## 2020-05-07 ENCOUNTER — Other Ambulatory Visit: Payer: Self-pay

## 2020-05-07 VITALS — BP 120/80 | HR 101 | Temp 98.4°F | Ht 64.0 in | Wt 212.0 lb

## 2020-05-07 DIAGNOSIS — E559 Vitamin D deficiency, unspecified: Secondary | ICD-10-CM | POA: Diagnosis not present

## 2020-05-07 DIAGNOSIS — Z9189 Other specified personal risk factors, not elsewhere classified: Secondary | ICD-10-CM

## 2020-05-07 DIAGNOSIS — R632 Polyphagia: Secondary | ICD-10-CM

## 2020-05-07 DIAGNOSIS — Z6836 Body mass index (BMI) 36.0-36.9, adult: Secondary | ICD-10-CM

## 2020-05-07 DIAGNOSIS — F419 Anxiety disorder, unspecified: Secondary | ICD-10-CM

## 2020-05-07 MED ORDER — FLUOXETINE HCL 20 MG PO TABS: 20.0000 mg | ORAL_TABLET | Freq: Every day | ORAL | 0 refills | Status: AC

## 2020-05-07 MED ORDER — VITAMIN D (ERGOCALCIFEROL) 1.25 MG (50000 UNIT) PO CAPS: 50000.0000 [IU] | ORAL_CAPSULE | ORAL | 0 refills | Status: AC

## 2020-05-07 MED ORDER — BD PEN NEEDLE NANO 2ND GEN 32G X 4 MM MISC: 1.0000 | Freq: Every day | 0 refills | Status: AC

## 2020-05-07 MED ORDER — SAXENDA 18 MG/3ML ~~LOC~~ SOPN: 3.0000 mg | PEN_INJECTOR | Freq: Every day | SUBCUTANEOUS | 0 refills | Status: AC

## 2020-05-07 NOTE — Progress Notes (Signed)
Chief Complaint:   OBESITY Amber Frost is here to discuss her progress with her obesity treatment plan along with follow-up of her obesity related diagnoses.   Today's visit was #: 14 Starting weight: 226 lbs Starting date: 02/28/2019 Today's weight: 212 lbs Today's date: 05/07/2020 Total lbs lost to date: 14 lbs Body mass index is 36.39 kg/m.  Total weight loss percentage to date: -6.19%  Interim History: Amber Frost is taking Vyvanse 30 mg daily.  She endorses polyphagia.  Nutrition Plan: the [place].  Anti-obesity medications: None.  Hunger is poorly controlled.  Activity: Walking for 30 minutes 3 times per week.  Assessment/Plan:   1. Polyphagia Hyperphagia, also called polyphagia, refers to excessive feelings of hunger. This is more likely to be an issues for people that have diabetes, prediabetes, or insulin resistance. She will continue to focus on protein-rich, low simple carbohydrate foods. We reviewed the importance of hydration, regular exercise for stress reduction, and restorative sleep.  - Start Liraglutide -Weight Management (SAXENDA) 18 MG/3ML SOPN; Inject 3 mg into the skin daily.  Dispense: 15 mL; Refill: 0 - Insulin Pen Needle (BD PEN NEEDLE NANO 2ND GEN) 32G X 4 MM MISC; 1 Package by Does not apply route daily.  Dispense: 100 each; Refill: 0  2. Vitamin D deficiency At goal. Current vitamin D is 60.3, tested on 06/27/2019. Optimal goal > 50 ng/dL.   Plan:  [x]   Continue Vitamin D @50 ,000 IU every week. []   Continue home supplement daily. [x]   Follow-up for routine testing of Vitamin D at least 2-3 times per year to avoid over-replacement.  - Refill Vitamin D, Ergocalciferol, (DRISDOL) 1.25 MG (50000 UNIT) CAPS capsule; Take 1 capsule (50,000 Units total) by mouth every 14 (fourteen) days.  Dispense: 2 capsule; Refill: 0  3. Anxiety with emotional eating Amber Frost is taking Prozac 20 mg daily. The current medical regimen is effective;  continue present  plan and medications.  - Refill FLUoxetine (PROZAC) 20 MG tablet; Take 1 tablet (20 mg total) by mouth daily.  Dispense: 90 tablet; Refill: 0  4. At risk for nausea Amber Frost was given approximately 15 minutes of nausea prevention counseling today. Amber Frost is at risk for nausea due to her new or current medication. She was encouraged to titrate her medication slowly, make sure to stay hydrated, eat smaller portions throughout the day, and avoid high fat meals.   5. Class 2 severe obesity with serious comorbidity and body mass index (BMI) of 36.0 to 36.9 in adult, unspecified obesity type Amber Frost)  Course: Amber Frost is currently in the action stage of change. As such, her goal is to continue with weight loss efforts.   Nutrition goals: She has agreed to keeping a food journal and adhering to recommended goals of 1200-1500 calories and 95+ grams of protein.   Exercise goals: For substantial health benefits, adults should do at least 150 minutes (2 hours and 30 minutes) a week of moderate-intensity, or 75 minutes (1 hour and 15 minutes) a week of vigorous-intensity aerobic physical activity, or an equivalent combination of moderate- and vigorous-intensity aerobic activity. Aerobic activity should be performed in episodes of at least 10 minutes, and preferably, it should be spread throughout the week.  Behavioral modification strategies: increasing lean protein intake, decreasing simple carbohydrates, increasing vegetables, increasing water intake, decreasing liquid calories and decreasing alcohol intake.  Amber Frost has agreed to follow-up with our clinic in 2 weeks. She was informed of the importance of frequent follow-up visits to  maximize her success with intensive lifestyle modifications for her multiple health conditions.   Objective:   Blood pressure 120/80, pulse (!) 101, temperature 98.4 F (36.9 C), height 5\' 4"  (1.626 m), weight 212 lb (96.2 kg), SpO2 96 %. Body mass index is 36.39  kg/m.  General: Cooperative, alert, well developed, in no acute distress. HEENT: Conjunctivae and lids unremarkable. Cardiovascular: Regular rhythm.  Lungs: Normal work of breathing. Neurologic: No focal deficits.   Lab Results  Component Value Date   CREATININE 0.74 06/27/2019   BUN 10 06/27/2019   NA 141 06/27/2019   K 3.9 06/27/2019   CL 98 06/27/2019   CO2 23 06/27/2019   Lab Results  Component Value Date   ALT 23 06/27/2019   AST 23 06/27/2019   ALKPHOS 67 06/27/2019   BILITOT 0.3 06/27/2019   Lab Results  Component Value Date   HGBA1C 5.0 02/28/2019   Lab Results  Component Value Date   INSULIN 13.1 06/27/2019   INSULIN 11.1 02/28/2019   Lab Results  Component Value Date   TSH 1.940 02/28/2019   Lab Results  Component Value Date   CHOL 183 02/28/2019   HDL 71 02/28/2019   LDLCALC 89 02/28/2019   LDLDIRECT 89.0 11/15/2017   TRIG 134 02/28/2019   CHOLHDL 3 11/15/2017   Lab Results  Component Value Date   WBC 4.5 06/27/2019   HGB 14.8 06/27/2019   HCT 41.6 06/27/2019   MCV 96 06/27/2019   PLT 228 06/27/2019   Lab Results  Component Value Date   IRON 87 06/27/2019   TIBC 300 06/27/2019   FERRITIN 256 (H) 06/27/2019   Attestation Statements:   Reviewed by clinician on day of visit: allergies, medications, problem list, medical history, surgical history, family history, social history, and previous encounter notes.  I, Water quality scientist, CMA, am acting as transcriptionist for Briscoe Deutscher, DO  I have reviewed the above documentation for accuracy and completeness, and I agree with the above. Briscoe Deutscher, DO

## 2020-05-26 ENCOUNTER — Ambulatory Visit (INDEPENDENT_AMBULATORY_CARE_PROVIDER_SITE_OTHER): Payer: BC Managed Care – PPO | Admitting: Family Medicine

## 2020-05-28 ENCOUNTER — Other Ambulatory Visit (INDEPENDENT_AMBULATORY_CARE_PROVIDER_SITE_OTHER): Payer: Self-pay | Admitting: Family Medicine

## 2020-05-28 DIAGNOSIS — F3289 Other specified depressive episodes: Secondary | ICD-10-CM

## 2020-06-01 ENCOUNTER — Other Ambulatory Visit (INDEPENDENT_AMBULATORY_CARE_PROVIDER_SITE_OTHER): Payer: Self-pay | Admitting: Family Medicine

## 2020-06-01 DIAGNOSIS — F419 Anxiety disorder, unspecified: Secondary | ICD-10-CM

## 2020-06-01 DIAGNOSIS — J302 Other seasonal allergic rhinitis: Secondary | ICD-10-CM

## 2020-06-05 ENCOUNTER — Other Ambulatory Visit (INDEPENDENT_AMBULATORY_CARE_PROVIDER_SITE_OTHER): Payer: Self-pay | Admitting: Family Medicine

## 2020-06-05 DIAGNOSIS — E559 Vitamin D deficiency, unspecified: Secondary | ICD-10-CM

## 2020-06-12 ENCOUNTER — Other Ambulatory Visit (INDEPENDENT_AMBULATORY_CARE_PROVIDER_SITE_OTHER): Payer: Self-pay | Admitting: Family Medicine

## 2020-06-12 DIAGNOSIS — R632 Polyphagia: Secondary | ICD-10-CM

## 2020-06-12 NOTE — Telephone Encounter (Signed)
Dr.Wallace °

## 2020-07-06 ENCOUNTER — Other Ambulatory Visit (INDEPENDENT_AMBULATORY_CARE_PROVIDER_SITE_OTHER): Payer: Self-pay | Admitting: Family Medicine

## 2020-07-06 DIAGNOSIS — E559 Vitamin D deficiency, unspecified: Secondary | ICD-10-CM

## 2020-07-06 DIAGNOSIS — F3289 Other specified depressive episodes: Secondary | ICD-10-CM

## 2020-07-06 DIAGNOSIS — R632 Polyphagia: Secondary | ICD-10-CM

## 2020-07-06 DIAGNOSIS — G4709 Other insomnia: Secondary | ICD-10-CM

## 2020-08-11 ENCOUNTER — Other Ambulatory Visit (INDEPENDENT_AMBULATORY_CARE_PROVIDER_SITE_OTHER): Payer: Self-pay | Admitting: Family Medicine

## 2020-08-11 DIAGNOSIS — F419 Anxiety disorder, unspecified: Secondary | ICD-10-CM

## 2020-09-24 ENCOUNTER — Other Ambulatory Visit (INDEPENDENT_AMBULATORY_CARE_PROVIDER_SITE_OTHER): Payer: Self-pay | Admitting: Family Medicine

## 2020-09-24 DIAGNOSIS — F419 Anxiety disorder, unspecified: Secondary | ICD-10-CM

## 2020-09-24 NOTE — Telephone Encounter (Signed)
Dr.Wallace °

## 2021-02-05 ENCOUNTER — Ambulatory Visit (INDEPENDENT_AMBULATORY_CARE_PROVIDER_SITE_OTHER): Payer: BC Managed Care – PPO | Admitting: Dermatology

## 2021-02-05 DIAGNOSIS — Z872 Personal history of diseases of the skin and subcutaneous tissue: Secondary | ICD-10-CM

## 2021-02-05 DIAGNOSIS — L905 Scar conditions and fibrosis of skin: Secondary | ICD-10-CM | POA: Diagnosis not present

## 2021-02-05 DIAGNOSIS — D229 Melanocytic nevi, unspecified: Secondary | ICD-10-CM

## 2021-02-05 DIAGNOSIS — L570 Actinic keratosis: Secondary | ICD-10-CM

## 2021-02-05 DIAGNOSIS — L821 Other seborrheic keratosis: Secondary | ICD-10-CM

## 2021-02-05 DIAGNOSIS — L578 Other skin changes due to chronic exposure to nonionizing radiation: Secondary | ICD-10-CM

## 2021-02-05 DIAGNOSIS — Z1283 Encounter for screening for malignant neoplasm of skin: Secondary | ICD-10-CM | POA: Diagnosis not present

## 2021-02-05 DIAGNOSIS — L814 Other melanin hyperpigmentation: Secondary | ICD-10-CM

## 2021-02-05 DIAGNOSIS — D1800 Hemangioma unspecified site: Secondary | ICD-10-CM

## 2021-02-05 DIAGNOSIS — L820 Inflamed seborrheic keratosis: Secondary | ICD-10-CM | POA: Diagnosis not present

## 2021-02-05 NOTE — Patient Instructions (Addendum)
Recommend Serica gel to scar at right nose  Cryotherapy Aftercare  Wash gently with soap and water everyday.   Apply Vaseline and Band-Aid daily until healed.    If you have any questions or concerns for your doctor, please call our main line at [phone] and press option 4 to reach your doctor's medical assistant. If no one answers, please leave a voicemail as directed and we will return your call as soon as possible. Messages left after 4 pm will be answered the following business day.   You may also send [place] a message via [place]. We typically respond to MyChart messages within 1-2 business days.  For prescription refills, please ask your pharmacy to contact our office. Our fax number is [phone].  If you have an urgent issue when the clinic is closed that cannot wait until the next business day, you can page your doctor at the number below.    Please note that while we do our best to be available for urgent issues outside of office hours, we are not available 24/7.   If you have an urgent issue and are unable to reach [place], you may choose to seek medical care at your doctor's office, retail clinic, urgent care center, or emergency room.  If you have a medical emergency, please immediately call 911 or go to the emergency department.  Pager Numbers  - Dr. Nehemiah Massed: [phone]  - Dr. Laurence Ferrari: [phone]  - Dr. Nicole Kindred: [phone]  In the event of inclement weather, please call our main line at [phone] for an update on the status of any delays or closures.  Dermatology Medication Tips: Please keep the boxes that topical medications come in in order to help keep track of the instructions about where and how to use these. Pharmacies typically print the medication instructions only on the boxes and not directly on the medication tubes.   If your medication is too expensive, please contact our office at [phone] option 4 or send [place] a message through [place].   We are  unable to tell what your co-pay for medications will be in advance as this is different depending on your insurance coverage. However, we may be able to find a substitute medication at lower cost or fill out paperwork to get insurance to cover a needed medication.   If a prior authorization is required to get your medication covered by your insurance company, please allow [place] 1-2 business days to complete this process.  Drug prices often vary depending on where the prescription is filled and some pharmacies may offer cheaper prices.  The website www.goodrx.com contains coupons for medications through different pharmacies. The prices here do not account for what the cost may be with help from insurance (it may be cheaper with your insurance), but the website can give you the price if you did not use any insurance.  - You can print the associated coupon and take it with your prescription to the pharmacy.  - You may also stop by our office during regular business hours and pick up a GoodRx coupon card.  - If you need your prescription sent electronically to a different pharmacy, notify our office through [facility] or by phone at [phone] option 4.

## 2021-02-05 NOTE — Progress Notes (Signed)
Follow-Up Visit   Subjective  Amber Frost is a 50 y.o. female who presents for the following: Annual Exam (History of Aks - TBSE today). The patient presents for Total-Body Skin Exam (TBSE) for skin cancer screening and mole check.  The following portions of the chart were reviewed this encounter and updated as appropriate:   Tobacco  Allergies  Meds  Problems  Med Hx  Surg Hx  Fam Hx     Review of Systems:  No other skin or systemic complaints except as noted in HPI or Assessment and Plan.  Objective  Well appearing patient in no apparent distress; mood and affect are within normal limits.  A full examination was performed including scalp, head, eyes, ears, nose, lips, neck, chest, axillae, abdomen, back, buttocks, bilateral upper extremities, bilateral lower extremities, hands, feet, fingers, toes, fingernails, and toenails. All findings within normal limits unless otherwise noted below.  Right infranasal Scar     Right forearm Erythematous thin papules/macules with gritty scale.   Right Upper Back x 4, left distal lat calf x 3 - Total 7 (7) Erythematous keratotic or waxy stuck-on papule or plaque.    Assessment & Plan   Lentigines - Scattered tan macules - Due to sun exposure - Benign-appering, observe - Recommend daily broad spectrum sunscreen SPF 30+ to sun-exposed areas, reapply every 2 hours as needed. - Call for any changes  Seborrheic Keratoses - Stuck-on, waxy, tan-brown papules and/or plaques  - Benign-appearing - Discussed benign etiology and prognosis. - Observe - Call for any changes  Melanocytic Nevi - Tan-brown and/or pink-flesh-colored symmetric macules and papules - Benign appearing on exam today - Observation - Call clinic for new or changing moles - Recommend daily use of broad spectrum spf 30+ sunscreen to sun-exposed areas.   Hemangiomas - Red papules - Discussed benign nature - Observe - Call for any changes  Actinic  Damage - Chronic condition, secondary to cumulative UV/sun exposure - diffuse scaly erythematous macules with underlying dyspigmentation - Recommend daily broad spectrum sunscreen SPF 30+ to sun-exposed areas, reapply every 2 hours as needed.  - Staying in the shade or wearing long sleeves, sun glasses (UVA+UVB protection) and wide brim hats (4-inch brim around the entire circumference of the hat) are also recommended for sun protection.  - Call for new or changing lesions.  Skin cancer screening performed today.  Scar Right infranasal Scar secondary to traumatic injury Recommend Serica gel  AK (actinic keratosis) Right forearm Recheck on follow up - may consider biopsy if persistent.  Destruction of lesion - Right forearm Complexity: simple   Destruction method: cryotherapy   Informed consent: discussed and consent obtained   Timeout:  patient name, date of birth, surgical site, and procedure verified Lesion destroyed using liquid nitrogen: Yes   Region frozen until ice ball extended beyond lesion: Yes   Outcome: patient tolerated procedure well with no complications   Post-procedure details: wound care instructions given    Inflamed seborrheic keratosis Right Upper Back x 4, left distal lat calf x 3 - Total 7 Recheck on follow up  Destruction of lesion - Right Upper Back x 4, left distal lat calf x 3 - Total 7 Complexity: simple   Destruction method: cryotherapy   Informed consent: discussed and consent obtained   Timeout:  patient name, date of birth, surgical site, and procedure verified Lesion destroyed using liquid nitrogen: Yes   Region frozen until ice ball extended beyond lesion: Yes   Outcome: patient  tolerated procedure well with no complications   Post-procedure details: wound care instructions given    Skin cancer screening  Return for 6-8 weeks follow up.  I, Ashok Cordia, CMA, am acting as scribe for Sarina Ser, MD . Documentation: I have reviewed the  above documentation for accuracy and completeness, and I agree with the above.  Sarina Ser, MD

## 2021-02-12 ENCOUNTER — Encounter: Payer: Self-pay | Admitting: Dermatology

## 2021-03-26 ENCOUNTER — Ambulatory Visit: Payer: BC Managed Care – PPO | Admitting: Dermatology

## 2021-04-20 ENCOUNTER — Other Ambulatory Visit (INDEPENDENT_AMBULATORY_CARE_PROVIDER_SITE_OTHER): Payer: Self-pay | Admitting: Family Medicine

## 2021-04-20 DIAGNOSIS — R632 Polyphagia: Secondary | ICD-10-CM

## 2021-05-28 ENCOUNTER — Ambulatory Visit (INDEPENDENT_AMBULATORY_CARE_PROVIDER_SITE_OTHER): Payer: Self-pay | Admitting: Family Medicine

## 2021-05-28 ENCOUNTER — Encounter (INDEPENDENT_AMBULATORY_CARE_PROVIDER_SITE_OTHER): Payer: Self-pay

## 2021-07-06 ENCOUNTER — Other Ambulatory Visit: Payer: Self-pay | Admitting: Internal Medicine

## 2021-07-06 DIAGNOSIS — Z1231 Encounter for screening mammogram for malignant neoplasm of breast: Secondary | ICD-10-CM

## 2021-07-06 DIAGNOSIS — M25512 Pain in left shoulder: Secondary | ICD-10-CM

## 2021-07-06 DIAGNOSIS — G8929 Other chronic pain: Secondary | ICD-10-CM

## 2021-07-20 ENCOUNTER — Ambulatory Visit: Payer: BC Managed Care – PPO

## 2021-07-31 ENCOUNTER — Ambulatory Visit: Payer: BC Managed Care – PPO

## 2021-08-03 ENCOUNTER — Other Ambulatory Visit: Payer: BC Managed Care – PPO

## 2022-01-13 ENCOUNTER — Encounter (INDEPENDENT_AMBULATORY_CARE_PROVIDER_SITE_OTHER): Payer: Self-pay
